# Patient Record
Sex: Female | Born: 1965 | Race: White | Hispanic: No | Marital: Single | Smoking: Never smoker
Health system: Southern US, Community
[De-identification: ages and names within clinical notes are randomized; demographics above are authoritative.]

## PROBLEM LIST (undated history)

## (undated) DIAGNOSIS — J309 Allergic rhinitis, unspecified: Secondary | ICD-10-CM

## (undated) DIAGNOSIS — F32A Depression, unspecified: Secondary | ICD-10-CM

## (undated) DIAGNOSIS — Z970 Presence of artificial eye: Secondary | ICD-10-CM

## (undated) DIAGNOSIS — R202 Paresthesia of skin: Secondary | ICD-10-CM

## (undated) DIAGNOSIS — R200 Anesthesia of skin: Secondary | ICD-10-CM

## (undated) DIAGNOSIS — F329 Major depressive disorder, single episode, unspecified: Secondary | ICD-10-CM

## (undated) DIAGNOSIS — K649 Unspecified hemorrhoids: Secondary | ICD-10-CM

## (undated) DIAGNOSIS — H269 Unspecified cataract: Secondary | ICD-10-CM

## (undated) HISTORY — DX: Unspecified cataract: H26.9

## (undated) HISTORY — PX: BACK SURGERY: SHX140

## (undated) HISTORY — DX: Unspecified hemorrhoids: K64.9

---

## 2004-11-29 HISTORY — PX: EYE SURGERY: SHX253

## 2006-02-27 HISTORY — PX: UPPER GI ENDOSCOPY: SHX6162

## 2006-03-02 ENCOUNTER — Ambulatory Visit: Payer: Self-pay | Admitting: Gastroenterology

## 2007-09-30 HISTORY — PX: COLONOSCOPY: SHX174

## 2007-10-11 ENCOUNTER — Ambulatory Visit: Payer: Self-pay | Admitting: Gastroenterology

## 2010-08-18 ENCOUNTER — Emergency Department: Payer: Self-pay | Admitting: Emergency Medicine

## 2010-08-19 ENCOUNTER — Emergency Department: Payer: Self-pay | Admitting: Emergency Medicine

## 2010-08-29 ENCOUNTER — Ambulatory Visit: Payer: Self-pay | Admitting: Family Medicine

## 2011-11-29 IMAGING — CR DG CHEST 2V
1 series · 2 of 2 positions shown · non-contrast
Comparison: none

REASON FOR EXAM: fall
COMMENTS:

PROCEDURE:     DXR - DXR CHEST PA (OR AP) AND LATERAL  - August 19, 2010  [DATE]
RESULT:     The lung fields are clear. No pneumonia, pneumothorax or pleural
effusion is seen. The mediastinal and osseous structures show no significant
abnormalities.

[Series 1: view not recorded · 0.17mm/px · 2 of 2 slices shown]
[im 1/2]
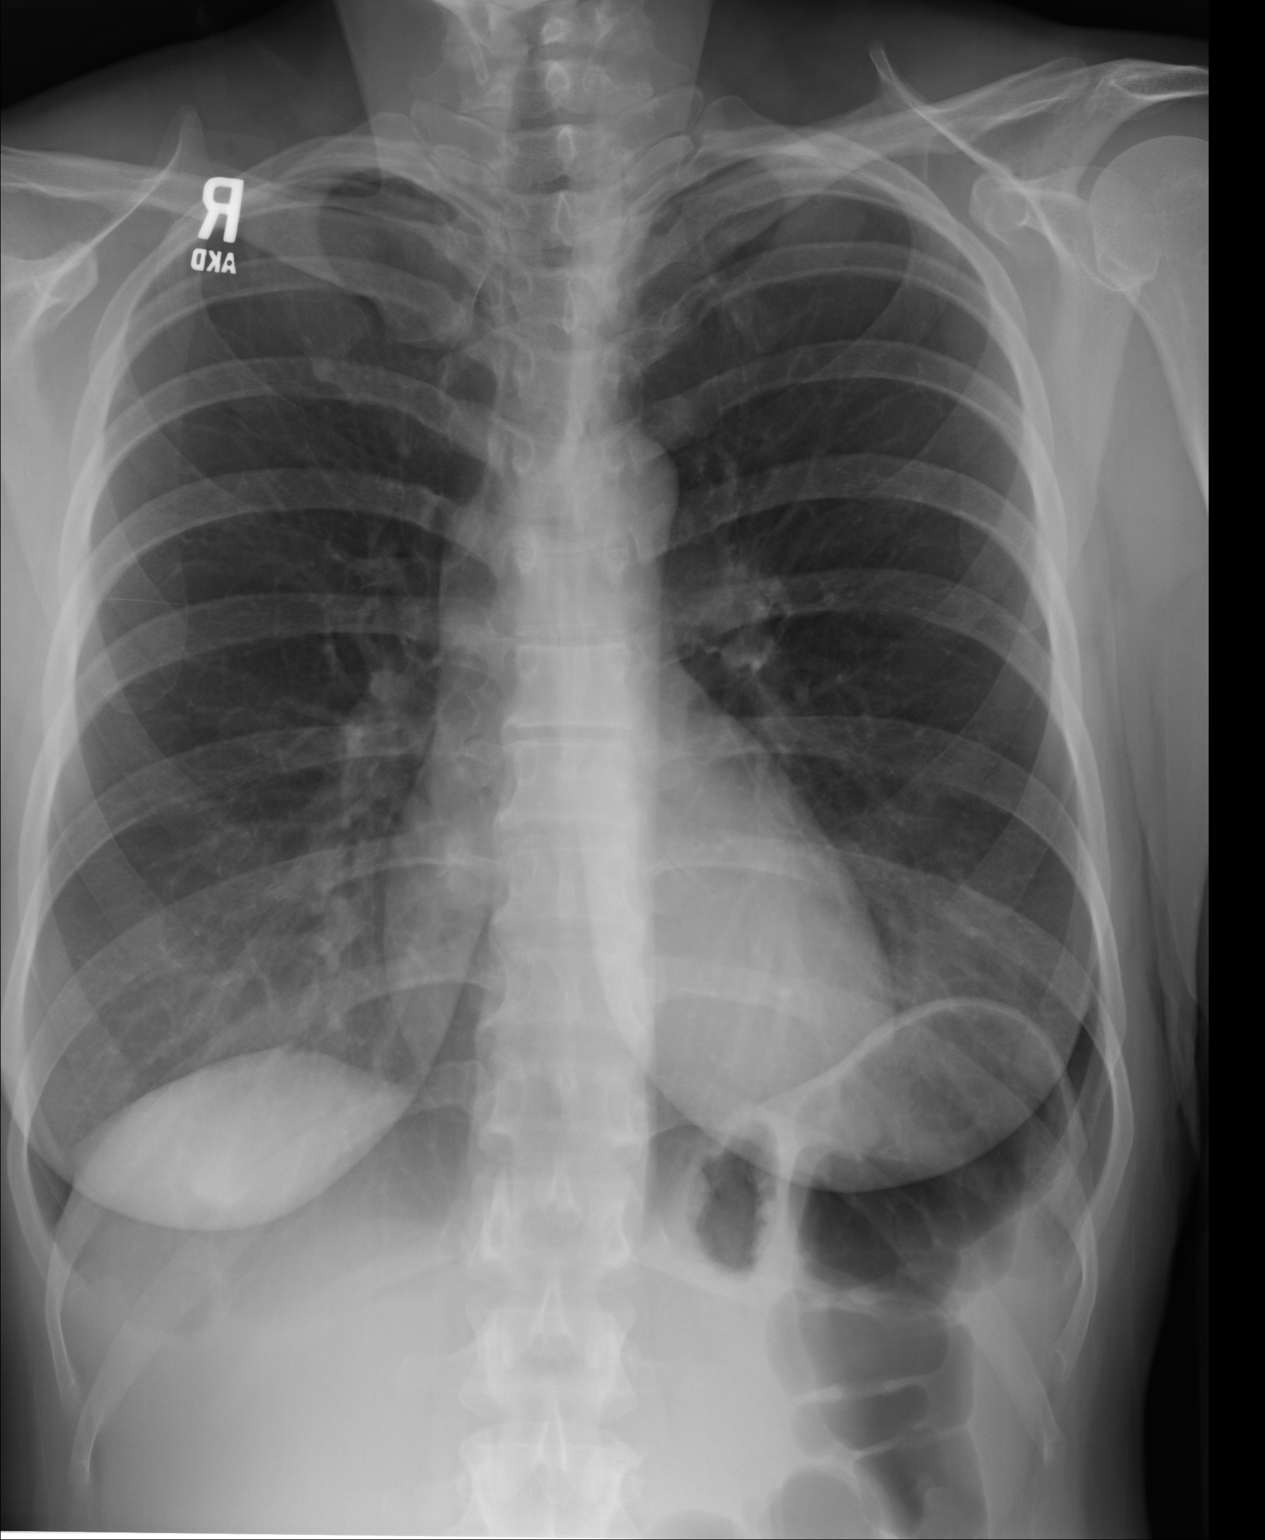
[im 2/2]
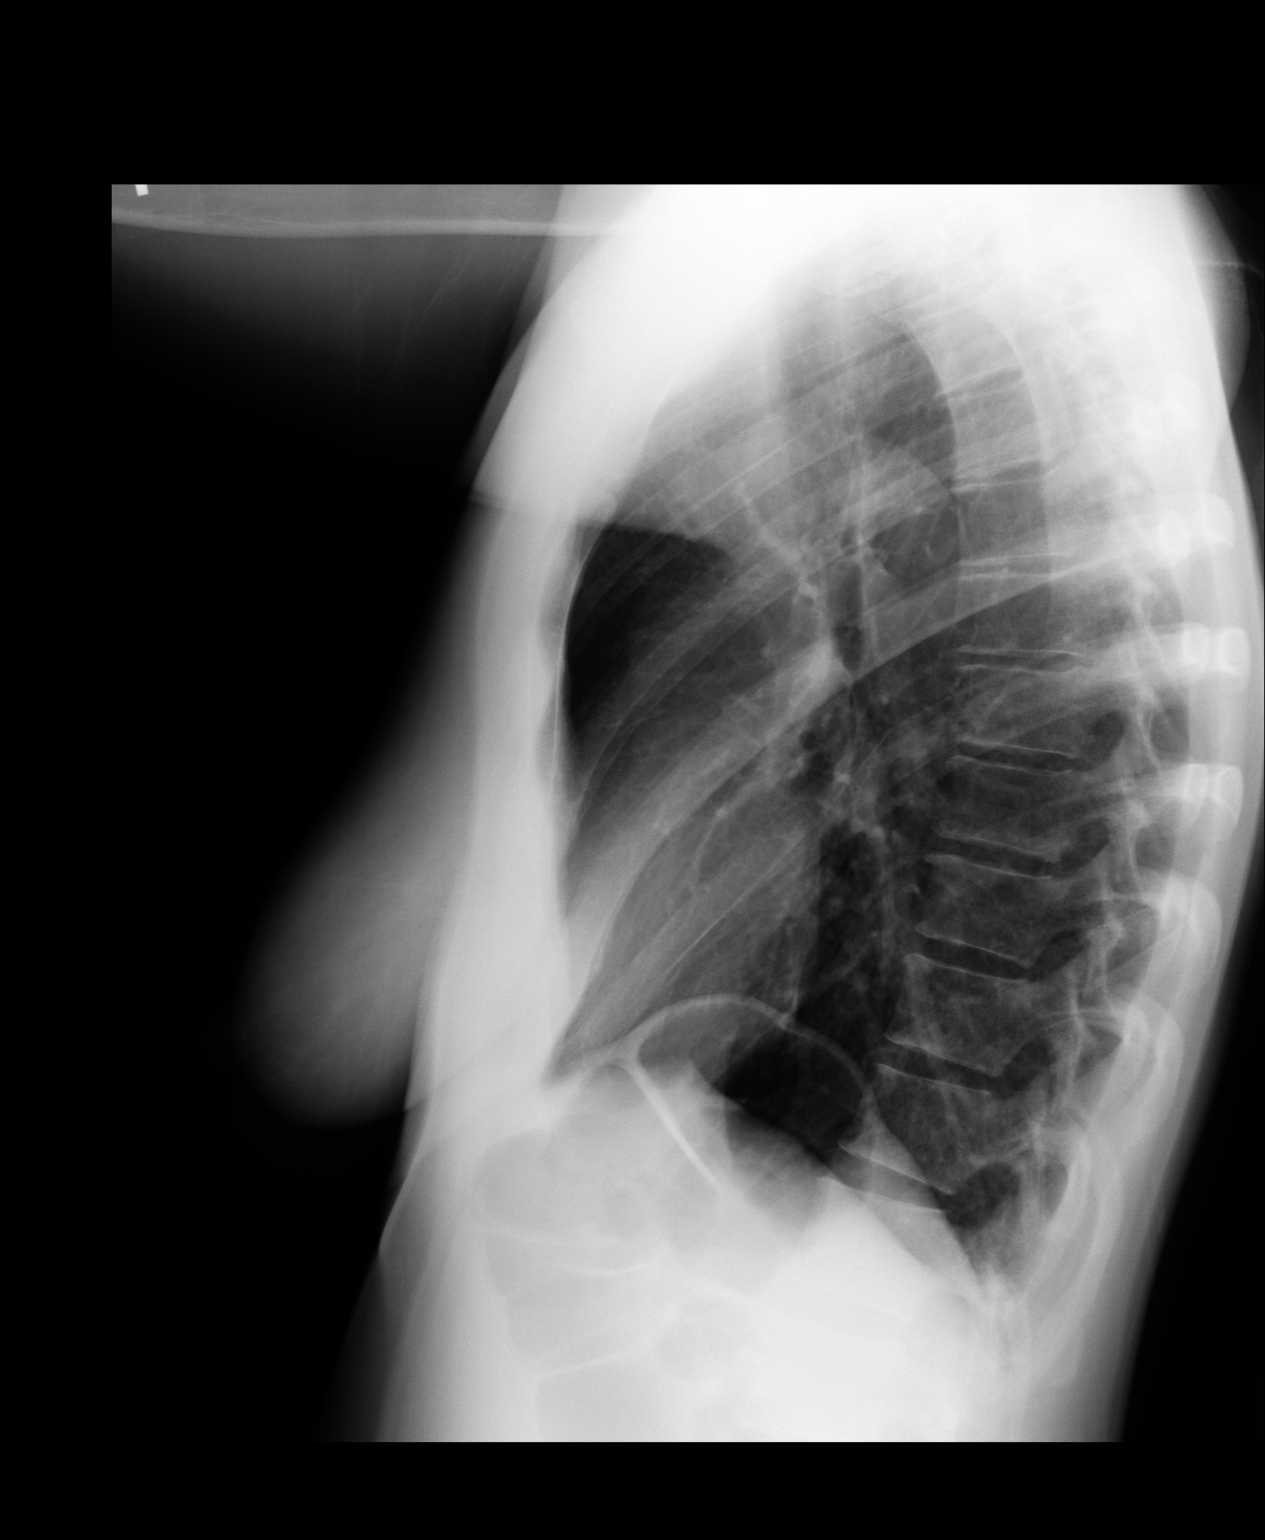

[2 of 2 positions shown; findings below may reference images not displayed]

IMPRESSION: No significant abnormalities are noted.

## 2012-07-27 ENCOUNTER — Emergency Department: Payer: Self-pay | Admitting: Emergency Medicine

## 2012-11-29 HISTORY — PX: APPENDECTOMY: SHX54

## 2013-04-30 ENCOUNTER — Observation Stay: Payer: Self-pay | Admitting: Surgery

## 2013-04-30 ENCOUNTER — Ambulatory Visit: Payer: Self-pay | Admitting: Internal Medicine

## 2013-04-30 LAB — PREGNANCY, URINE: Pregnancy Test, Urine: NEGATIVE m[IU]/mL

## 2013-05-02 LAB — PATHOLOGY REPORT

## 2013-12-05 ENCOUNTER — Ambulatory Visit: Payer: Self-pay | Admitting: Surgery

## 2014-06-03 ENCOUNTER — Ambulatory Visit: Payer: Self-pay

## 2014-10-21 ENCOUNTER — Encounter: Payer: Self-pay | Admitting: General Surgery

## 2014-11-06 ENCOUNTER — Other Ambulatory Visit: Payer: Self-pay | Admitting: General Surgery

## 2014-11-06 ENCOUNTER — Encounter: Payer: Self-pay | Admitting: General Surgery

## 2014-11-06 ENCOUNTER — Ambulatory Visit (INDEPENDENT_AMBULATORY_CARE_PROVIDER_SITE_OTHER): Payer: BC Managed Care – PPO | Admitting: General Surgery

## 2014-11-06 VITALS — BP 118/72 | HR 74 | Resp 14 | Ht 65.0 in | Wt 155.0 lb

## 2014-11-06 DIAGNOSIS — Z800 Family history of malignant neoplasm of digestive organs: Secondary | ICD-10-CM

## 2014-11-06 DIAGNOSIS — Z1211 Encounter for screening for malignant neoplasm of colon: Secondary | ICD-10-CM

## 2014-11-06 MED ORDER — POLYETHYLENE GLYCOL 3350 17 GM/SCOOP PO POWD: ORAL | Status: DC

## 2014-11-06 NOTE — Progress Notes (Signed)
Patient ID: Erin [name], female   DOB: [birth year], 48 y.o.   MRN: [id number]  Chief Complaint  Patient presents with  . Other    screening colonoscopy    HPI Erin [name] is a 48 y.o. female here today for a evaluation of a screenning colonoscopy . Patient states no Gi problems. Last colonoscopy was done in 10/11/2007 at [facility] by Dr. Servando SnareWohl. Review of the procedure note showed no abnormalities identified. HPI  No past medical history on file.  Past Surgical History  Procedure Laterality Date  . Appendectomy  2014  . Back surgery  2013  . Upper gi endoscopy  02/2006  . Colonoscopy  09/2007  . Eye surgery Right 2006    Family History  Problem Relation Age of Onset  . Colon cancer Paternal Grandmother     4750's  . Colon cancer Paternal Aunt     great aunt 2  . Colon cancer Paternal Uncle     great    Social History History  Substance Use Topics  . Smoking status: Never Smoker   . Smokeless tobacco: Never Used  . Alcohol Use: No    No Known Allergies  Current Outpatient Prescriptions  Medication Sig Dispense Refill  . calcium carbonate 200 MG capsule Take 250 mg by mouth daily.    . citalopram (CELEXA) 20 MG tablet Take 1 tablet by mouth daily.  1  . polyethylene glycol powder (GLYCOLAX/MIRALAX) powder 255 grams one bottle for colonoscopy prep 255 g 0   No current facility-administered medications for this visit.    Review of Systems Review of Systems  Constitutional: Negative.   Respiratory: Negative.   Cardiovascular: Negative.     Blood pressure 118/72, pulse 74, resp. rate 14, height 5\' 5"  (1.651 m), weight 155 lb (70.308 kg), last menstrual period 10/24/2014.  Physical Exam Physical Exam  Constitutional: She is oriented to person, place, and time. She appears well-developed and well-nourished.  Cardiovascular: Normal rate and regular rhythm.   Pulmonary/Chest: Effort normal and breath sounds normal.  Neurological: She is alert and oriented to person,  place, and time.  Skin: Skin is warm and dry.    Data Reviewed 2008 colonoscopy report  Assessment    Distant family history of colon cancer. No first-degree relatives. Now at average age for screening exams to begin.    Plan    The pros and cons of screening colonoscopy with her family history was reviewed. The risks of the procedure including those related to bleeding and perforation were discussed.    Patient has been scheduled for [facility]11[facility].    Earline MayotteByrnett, Nickolette Espinola W 11/06/2014, 6:41 PM

## 2014-11-06 NOTE — Patient Instructions (Addendum)
Colonoscopy A colonoscopy is an exam to look at the entire large intestine (colon). This exam can help find problems such as tumors, polyps, inflammation, and areas of bleeding. The exam takes about 1 hour.  LET [facility]OUR HEALTH CARE PROVIDER KNOW ABOUT:   Any allergies you have.  All medicines you are taking, including vitamins, herbs, eye drops, creams, and over-the-counter medicines.  Previous problems you or members of your family have had with the use of anesthetics.  Any blood disorders you have.  Previous surgeries you have had.  Medical conditions you have. RISKS AND COMPLICATIONS  Generally, this is a safe procedure. However, as with any procedure, complications can occur. Possible complications include:  Bleeding.  Tearing or rupture of the colon wall.  Reaction to medicines given during the exam.  Infection (rare). BEFORE THE PROCEDURE   Ask your health care provider about changing or stopping your regular medicines.  You may be prescribed an oral bowel prep. This involves drinking a large amount of medicated liquid, starting the day before your procedure. The liquid will cause you to have multiple loose stools until your stool is almost clear or light green. This cleans out your colon in preparation for the procedure.  Do not eat or drink anything else once you have started the bowel prep, unless your health care provider tells you it is safe to do so.  Arrange for someone to drive you home after the procedure. PROCEDURE   You will be given medicine to help you relax (sedative).  You will lie on your side with your knees bent.  A long, flexible tube with a light and camera on the end (colonoscope) will be inserted through the rectum and into the colon. The camera sends video back to a computer screen as it moves through the colon. The colonoscope also releases carbon dioxide gas to inflate the colon. This helps your health care provider see the area better.  During  the exam, your health care provider may take a small tissue sample (biopsy) to be examined under a microscope if any abnormalities are found.  The exam is finished when the entire colon has been viewed. AFTER THE PROCEDURE   Do not drive for 24 hours after the exam.  You may have a small amount of blood in your stool.  You may pass moderate amounts of gas and have mild abdominal cramping or bloating. This is caused by the gas used to inflate your colon during the exam.  Ask when your test results will be ready and how you will get your results. Make sure you get your test results. Document Released: 11/12/2000 Document Revised: 09/05/2013 Document Reviewed: 07/23/2013 [facility] Patient Information 2015 [employer] [place]. This information is not intended to replace advice given to you by your health care provider. Make sure you discuss any questions you have with your health care provider.  Patient [facility] been [facility].

## 2014-12-02 ENCOUNTER — Telehealth: Payer: Self-pay | Admitting: *Deleted

## 2014-12-02 NOTE — Telephone Encounter (Signed)
Patient confirms no medication changes since last office visit. She does have Miralax prescription but has yet to pre-register. Patient was instructed to pre-register no later than Friday of this week. This patient states she will try and complete this online.   We will proceed with colonoscopy as scheduled for next week. Patient instructed to call should she have further questions.

## 2014-12-02 NOTE — Telephone Encounter (Signed)
Message left for patient to call the office.   Patient is scheduled for a colonoscopy at [facility] for 12-11-14. We need to confirm that she has had no medication changes since last office visit. Also, need to confirm that she has Miralax prescription and has pre-registered.

## 2014-12-11 ENCOUNTER — Ambulatory Visit: Payer: Self-pay | Admitting: General Surgery

## 2014-12-11 DIAGNOSIS — Z1211 Encounter for screening for malignant neoplasm of colon: Secondary | ICD-10-CM

## 2014-12-12 ENCOUNTER — Encounter: Payer: Self-pay | Admitting: General Surgery

## 2015-03-21 NOTE — Discharge Summary (Signed)
Dates of Admission and Diagnosis:  Date of Admission 30-Apr-2013   Date of Discharge 01-Jan-0001   DISCHARGE INSTRUCTIONS HOME MEDS:  Medication Reconciliation: Patient's Home Medications at Discharge:     Medication Instructions  celexa  1 tab(s) orally once a day; pt doesn't remember dosage   acetaminophen-hydrocodone 325 mg-5 mg oral tablet  1 tab(s) orally every 4 hours, As needed, pain     Physician's Instructions:  Diet Regular   Electronic Signatures: Marylou MccoyLobaugh, Nora (RN)  (Signed 03-Jun-14 20:01)  Authored: ADMISSION DATE AND DIAGNOSIS, DISCHARGE INSTRUCTIONS HOME MEDS, PATIENT INSTRUCTIONS   Last Updated: 03-Jun-14 20:01 by Marylou MccoyLobaugh, Nora (RN)

## 2015-03-21 NOTE — H&P (Signed)
   Subjective/Chief Complaint RLQ pain, nausea x 2 days   History of Present Illness Erin [name] is a pleasant 49 yo F with a history of back surgeries who presents with 2 days of RLQ pain and nausea.  She became nauseated with diarrhea approx 11 am.  She took immodium initially.  No more diarrhea.  Developed RLQ pain that night.  Worsening.  Worse with movement.  Last PO was some toast this am but otherwise just crackers since then.  No similar pain in past.  + subjective fevers and chills.  WBC 6.3.  Otherwise labs unremarkable.   Past History Eye prosthesis 6 years ago H/o backsurgery for ruptured disk   Past Med/Surgical Hx:  back injury from fall:   prostetic eye:   ALLERGIES:  No Known Allergies:   HOME MEDICATIONS: Medication Instructions Status  acetaminophen-oxyCODONE 325 mg-5 mg oral tablet 1 or 2  tab(s) orally every 6 hours Active  tramadol 50 mg oral tablet 1 tab(s) orally every 4 hours Active   Family and Social History:  Family History Coronary Artery Disease  Cancer  GM with colon cancer, Dad with MI   Social History negative tobacco, negative ETOH, negative Illicit drugs   Place of Living Home  Here with husband/daughter   Review of Systems:  Subjective/Chief Complaint RLQ pain, nausea/vomiting   Fever/Chills Yes   Cough No   Sputum No   Abdominal Pain Yes   Diarrhea No  Resolved   Constipation No   Nausea/Vomiting Yes   SOB/DOE No   Chest Pain No   Dysuria No   Physical Exam:  GEN Appears uncomfortable   HEENT pink conjunctivae, PERRL, hearing intact to voice   RESP normal resp effort  clear BS  no use of accessory muscles   CARD regular rate  no murmur  no thrills  No LE edema   ABD positive tenderness  no hernia  soft  normal BS  hypoactive BS   EXTR negative cyanosis/clubbing, negative edema   SKIN normal to palpation, No rashes, No ulcers   NEURO cranial nerves intact, negative Babinski R/L, negative rigidity, negative tremor,  follows commands, strength:   PSYCH A+O to time, place, person, good insight    Assessment/Admission Diagnosis Erin [name] is a pleasant 49 yo F with 2 days of RLQ pain, nausea.  CT shows edematous appendix.  Tender to palpation RLQ.  WBC normal.   Plan Although I am unable to explain why Erin [name] has a normal WBC, I feel that overall her presentation and imaging findings are consistent with acute appendicitis.  [facility] and will defer final decision to [facility], Dr. Anda KraftMarterre.   Electronic Signatures: Jarvis NewcomerLundquist, Relena Ivancic A (MD)  (Signed 02-Jun-14 15:37)  Authored: CHIEF COMPLAINT and HISTORY, PAST MEDICAL/SURGIAL HISTORY, ALLERGIES, HOME MEDICATIONS, FAMILY AND SOCIAL HISTORY, REVIEW OF SYSTEMS, PHYSICAL EXAM, ASSESSMENT AND PLAN   Last Updated: 02-Jun-14 15:37 by Jarvis NewcomerLundquist, Alexius Hangartner A (MD)

## 2015-03-21 NOTE — Op Note (Signed)
PATIENT NAME:  Erin [name], Erin [name] MR#:  [id number] DATE OF BIRTH:  1966-05-21  DATE OF PROCEDURE:  05/01/2013  PREOPERATIVE DIAGNOSIS: Acute appendicitis.   POSTOPERATIVE DIAGNOSIS: Acute appendicitis.   PROCEDURE: Laparoscopic appendectomy.   SURGEON: Raynald KempMark A Worthy Boschert, M.[name]. FACS  ASSISTANT: Scrub tech.   TYPE OF ANESTHESIA: General oral endotracheal.   FINDINGS: Acute suppurative appendicitis.   SPECIMENS: Appendix.   ESTIMATED BLOOD LOSS: Minimal.  LAP AND NEEDLE COUNT: Correct x 2.   DESCRIPTION OF PROCEDURE: With informed consent, supine position, general oral endotracheal anesthesia was induced. The left arm was padded and tucked at her side. Abdomen was widely prepped and draped with ChloraPrep solution. Timeout was observed.   A 12 mm blunt Hassan trocar was placed through a short transverse infraumbilical skin incision with scalpel and carried down with sharp dissection to the abdominal midline fascia, which was incised in the midline and elevated with Kocher clamps. The peritoneum was entered bluntly. A 12 mm blunt Hassan trocar was placed. Pneumoperitoneum was established. A 5 mm Bladeless trocar was placed in the right upper quadrant. A 5 mm Bladeless trocar in the left lower quadrant. The patient was then positioned in Trendelenburg and airplane right side up. The appendix was readily identifiable, grasped along its midportion as the tip was inflamed with a suppurative inflammatory response. The base of the appendix was identified clearly. A window was fashioned at the base of the appendix separating the mesoappendix from the appendix. The mesoappendix was easily divided with a single fire of the white load of the endoscopic GIA 35 mm stapler with the base of the appendix then being divided with a blue load of the stapler.   The specimen was captured in an Endo Catch device and easily retrieved.   The right lower quadrant was irrigated with a total of 1 L of normal saline. Inspection  of the pelvis demonstrated a moderate sized uterine fibroid. Right ovary and tube appeared normal. Left tube and ovary could not be identified due to port placement.   Ports were then removed under direct visualization. The infraumbilical fascial defect being reapproximated with a figure-of-eight #0 Vicryl suture in vertical orientation. A total of 30 mL of 0.25% plain Marcaine was infiltrated along all skin and fascial incisions prior to closure. 4-0 Vicryl subcuticular was applied to all skin edges followed by the application of benzoin, Steri-Strips, Telfa, and Tegaderm. The patient was then subsequently extubated and taken to the recovery room in stable and satisfactory condition by anesthesia services.    ____________________________ Redge GainerMark A. Egbert GaribaldiBird, MD mab:aw [name]: 05/01/2013 07:38:40 ET T: 05/01/2013 07:49:56 ET JOB#: [id number]  cc: Loraine LericheMark A. Egbert GaribaldiBird, MD, <Dictator> Jorje GuildGlenn R. Beckey DowningWillett, MD Raynald KempMARK A Eriel Dunckel MD ELECTRONICALLY SIGNED 05/01/2013 22:33

## 2015-03-21 NOTE — Discharge Summary (Signed)
PATIENT NAME:  Erin [name], Erin [name] MR#:  [id number] DATE OF BIRTH:  [birth year]  DATE OF ADMISSION:  04/30/2013 DATE OF DISCHARGE:  05/01/2013  FINAL DIAGNOSIS: Acute suppurative appendicitis.   PROCEDURE PERFORMED: Laparoscopic appendectomy by Dr. Egbert GaribaldiBird on 05/01/2013.   HOSPITAL COURSE SUMMARY: The patient was admitted with acute appendicitis both by clinical examination and by radiograms. I assumed her care from Dr. Juliann PulseLundquist, who had admitted her through the office. On the late evening of 2nd of June, early morning 3rd of June, the patient underwent an uncomplicated laparoscopic appendectomy. Suppurative findings were encountered. There was no evidence of peritonitis or perforation. Her postoperative course was unremarkable. She tolerated a regular diet, ambulated and did not have nausea and vomiting, adequate pain control with oral narcotics, and was discharged home on the evening of June 3 into the care of her family.   DISCHARGE MEDICATIONS: Celexa 1 tab by mouth once a day, acetaminophen/hydrocodone 5/325 mg tablet 1 tab every 4 hours as needed for pain.   She will follow up with me in the office on Monday or Tuesday of next week in [place].  Call with any questions or concerns.    ____________________________ Redge GainerMark A. Egbert GaribaldiBird, MD mab:cc [name]: 05/01/2013 20:56:00 ET T: 05/01/2013 22:56:50 ET JOB#: [id number]  cc: Jorje GuildGlenn R. Beckey DowningWillett, MD Redge GainerMark A. Egbert GaribaldiBird, MD, <Dictator>   Prem Coykendall A Lashandra Arauz MD ELECTRONICALLY SIGNED 05/02/2013 0:04

## 2015-07-15 ENCOUNTER — Other Ambulatory Visit: Payer: Self-pay | Admitting: Family Medicine

## 2015-07-15 DIAGNOSIS — R29898 Other symptoms and signs involving the musculoskeletal system: Secondary | ICD-10-CM

## 2015-07-15 DIAGNOSIS — R200 Anesthesia of skin: Secondary | ICD-10-CM

## 2015-07-16 ENCOUNTER — Ambulatory Visit
Admission: RE | Admit: 2015-07-16 | Discharge: 2015-07-16 | Disposition: A | Discharge status: Home / Self Care | Payer: BC Managed Care – PPO | Source: Ambulatory Visit | Attending: Family Medicine | Admitting: Family Medicine

## 2015-07-16 DIAGNOSIS — R29898 Other symptoms and signs involving the musculoskeletal system: Secondary | ICD-10-CM

## 2015-07-16 DIAGNOSIS — M2141 Flat foot [pes planus] (acquired), right foot: Secondary | ICD-10-CM | POA: Diagnosis present

## 2015-07-16 DIAGNOSIS — Z9889 Other specified postprocedural states: Secondary | ICD-10-CM | POA: Insufficient documentation

## 2015-07-16 DIAGNOSIS — M4806 Spinal stenosis, lumbar region: Secondary | ICD-10-CM | POA: Diagnosis not present

## 2015-07-16 DIAGNOSIS — R200 Anesthesia of skin: Secondary | ICD-10-CM

## 2015-07-16 DIAGNOSIS — M5126 Other intervertebral disc displacement, lumbar region: Secondary | ICD-10-CM | POA: Insufficient documentation

## 2015-09-01 ENCOUNTER — Encounter: Payer: Self-pay | Admitting: Surgery

## 2015-09-01 ENCOUNTER — Ambulatory Visit (INDEPENDENT_AMBULATORY_CARE_PROVIDER_SITE_OTHER): Payer: BC Managed Care – PPO | Admitting: Surgery

## 2015-09-01 ENCOUNTER — Encounter (INDEPENDENT_AMBULATORY_CARE_PROVIDER_SITE_OTHER): Payer: Self-pay

## 2015-09-01 VITALS — BP 129/69 | HR 76 | Temp 98.1°F | Ht 65.0 in | Wt 152.0 lb

## 2015-09-01 DIAGNOSIS — K645 Perianal venous thrombosis: Secondary | ICD-10-CM | POA: Diagnosis not present

## 2015-09-01 DIAGNOSIS — Z970 Presence of artificial eye: Secondary | ICD-10-CM | POA: Insufficient documentation

## 2015-09-01 DIAGNOSIS — F329 Major depressive disorder, single episode, unspecified: Secondary | ICD-10-CM | POA: Insufficient documentation

## 2015-09-01 DIAGNOSIS — F32A Depression, unspecified: Secondary | ICD-10-CM | POA: Insufficient documentation

## 2015-09-01 MED ORDER — LIDOCAINE 5 % EX OINT: 1.0000 "application " | TOPICAL_OINTMENT | Freq: Four times a day (QID) | CUTANEOUS | Status: DC | PRN

## 2015-09-01 NOTE — Patient Instructions (Signed)
Warm water soaking, ice if needed, high fiber diet, plenty of water, keep stools soft.

## 2015-09-01 NOTE — Progress Notes (Signed)
Subjective:     Patient ID: Erin [name], female   DOB: 1966-10-28, 49 y.o.   MRN: [id number]  HPI  49 yr old female with hemorrhoidal pain for about 2weeks.  Patient had back surgery at the end of August and began taking stool softeners before to avoid hemorrhoids but had them flair despite keeping stools soft.  She states that she has had hemorrhoids in the past but nothing with this much pain.  Patient states that she had some blood on the tissue and in underwear requiring a pad but none in the toilet bowl.  Patient does have a family hx of colon ca in distant relatives but had colonoscopy in January without any evidence of disease.    Past Medical History:  Back pain, Eye surgery, hemorrhoids  Past Surgical History  Procedure Laterality Date  . Appendectomy  2014  . Back surgery  2013  . Upper gi endoscopy  02/2006  . Colonoscopy  09/2007  . Eye surgery Right 2006   Family History  Problem Relation Age of Onset  . Colon cancer Paternal Grandmother     46's  . Colon cancer Paternal Aunt     great aunt 2  . Colon cancer Paternal Uncle     great    Social History   Social History  . Marital Status: Legally Separated    Spouse Name: N/A  . Number of Children: N/A  . Years of Education: N/A   Social History Main Topics  . Smoking status: Never Smoker   . Smokeless tobacco: Never Used  . Alcohol Use: No  . Drug Use: No  . Sexual Activity: Not Asked   Other Topics Concern  . None   Social History Narrative    Current outpatient prescriptions:  .  gabapentin (NEURONTIN) 300 MG capsule, 4 (four) times daily. , Disp: , Rfl: 0 .  HYDROcodone-acetaminophen (NORCO/VICODIN) 5-325 MG tablet, Take by mouth., Disp: , Rfl:  .  lidocaine-hydrocortisone (ANAMANTEL HC) 3-0.5 % CREA, Apply topically., Disp: , Rfl:  .  polyethylene glycol powder (GLYCOLAX/MIRALAX) powder, 255 grams one bottle for colonoscopy prep, Disp: 255 g, Rfl: 0 .  Sennosides-Docusate Sodium (STOOL SOFTENER &  LAXATIVE PO), Take by mouth daily., Disp: , Rfl:  .  lidocaine (XYLOCAINE) 5 % ointment, Apply 1 application topically 4 (four) times daily as needed., Disp: 35.44 g, Rfl: 1  Allergies  Allergen Reactions  . Doxycycline     Other reaction(s): Other (See Comments) Blister in mouth      Review of Systems  Constitutional: Negative for fever, chills, activity change, appetite change and fatigue.  Gastrointestinal: Positive for diarrhea, blood in stool, anal bleeding and rectal pain. Negative for nausea, vomiting, abdominal pain, constipation and abdominal distention.  Genitourinary: Negative for dysuria, frequency, flank pain and difficulty urinating.  All other systems reviewed and are negative.  Filed Vitals:   09/01/15 1345  BP: 129/69  Pulse: 76  Temp: 98.1 F (36.7 C)       Objective:   Physical Exam  Constitutional: She is oriented to person, place, and time. She appears well-developed and well-nourished. No distress.  Cardiovascular: Normal rate and intact distal pulses.   Pulmonary/Chest: Effort normal. No respiratory distress.  Abdominal: Soft. She exhibits no distension. There is no tenderness.  Genitourinary:  Rectal exam: good anal wink, left sided small external hemorrhoid with minimal inflammation and tenderness mainly posterior laterally, no acute thrombosis, no bleeding, no masses  Neurological: She is alert and oriented  to person, place, and time.  Skin: Skin is warm and dry.  Psychiatric: She has a normal mood and affect. Her behavior is normal. Thought content normal.  Vitals reviewed.      Assessment:     49 yr old with hemorrhoids     Plan:     Likely thrombosed 2 weeks prior and improving now.  No acute need for surgical intervention.  Discussed management with warm bath soaks, ice packs, high fiber diet, stool softeners, topical creams and lidocaine creams.  Will give her a Rx for the Lidocaine cream.  Instructed that it should gradually improve from  this time.  May return prn if worsens or does not improve.

## 2015-11-10 ENCOUNTER — Other Ambulatory Visit: Payer: Self-pay | Admitting: Unknown Physician Specialty

## 2015-11-10 DIAGNOSIS — R928 Other abnormal and inconclusive findings on diagnostic imaging of breast: Secondary | ICD-10-CM

## 2015-11-12 ENCOUNTER — Other Ambulatory Visit: Payer: Self-pay | Admitting: *Deleted

## 2015-11-12 ENCOUNTER — Inpatient Hospital Stay
Admission: RE | Admit: 2015-11-12 | Discharge: 2015-11-12 | Disposition: A | Discharge status: Home / Self Care | Payer: Self-pay | Source: Ambulatory Visit | Attending: *Deleted | Admitting: *Deleted

## 2015-11-12 DIAGNOSIS — Z9289 Personal history of other medical treatment: Secondary | ICD-10-CM

## 2015-11-21 ENCOUNTER — Other Ambulatory Visit: Payer: Self-pay | Admitting: Unknown Physician Specialty

## 2015-11-21 ENCOUNTER — Ambulatory Visit
Admission: RE | Admit: 2015-11-21 | Discharge: 2015-11-21 | Disposition: A | Discharge status: Home / Self Care | Payer: BC Managed Care – PPO | Source: Ambulatory Visit | Attending: Unknown Physician Specialty | Admitting: Unknown Physician Specialty

## 2015-11-21 DIAGNOSIS — N63 Unspecified lump in breast: Secondary | ICD-10-CM | POA: Diagnosis not present

## 2015-11-21 DIAGNOSIS — R928 Other abnormal and inconclusive findings on diagnostic imaging of breast: Secondary | ICD-10-CM | POA: Diagnosis present

## 2015-11-21 DIAGNOSIS — N6002 Solitary cyst of left breast: Secondary | ICD-10-CM | POA: Diagnosis not present

## 2015-12-02 ENCOUNTER — Other Ambulatory Visit: Payer: Self-pay | Admitting: Unknown Physician Specialty

## 2015-12-02 ENCOUNTER — Ambulatory Visit: Payer: BC Managed Care – PPO | Admitting: General Surgery

## 2015-12-02 DIAGNOSIS — N63 Unspecified lump in unspecified breast: Secondary | ICD-10-CM

## 2015-12-03 ENCOUNTER — Ambulatory Visit
Admission: RE | Admit: 2015-12-03 | Discharge: 2015-12-03 | Disposition: A | Discharge status: Home / Self Care | Payer: BC Managed Care – PPO | Source: Ambulatory Visit | Attending: Unknown Physician Specialty | Admitting: Unknown Physician Specialty

## 2015-12-03 DIAGNOSIS — N63 Unspecified lump in unspecified breast: Secondary | ICD-10-CM

## 2015-12-03 DIAGNOSIS — N62 Hypertrophy of breast: Secondary | ICD-10-CM | POA: Diagnosis not present

## 2015-12-03 HISTORY — PX: BREAST BIOPSY: SHX20

## 2015-12-04 LAB — SURGICAL PATHOLOGY

## 2015-12-28 IMAGING — US US BREAST LTD UNI LEFT INC AXILLA
1 series · 12 of 25 positions shown · non-contrast
Comparison: Previous examinations, including the 2D screening
mammogram at [facility] on 11/05/2015.

CLINICAL DATA: Possible outer left breast asymmetry on a recent
screening mammogram at [facility].

EXAM:
DIGITAL DIAGNOSTIC LEFT MAMMOGRAM WITH 3D TOMOSYNTHESIS WITH CAD
ULTRASOUND LEFT BREAST

[Series 1: us breast ltd uni left inc axilla · 0.06mm/px · 12 of 34 slices shown]
[im 2/34]
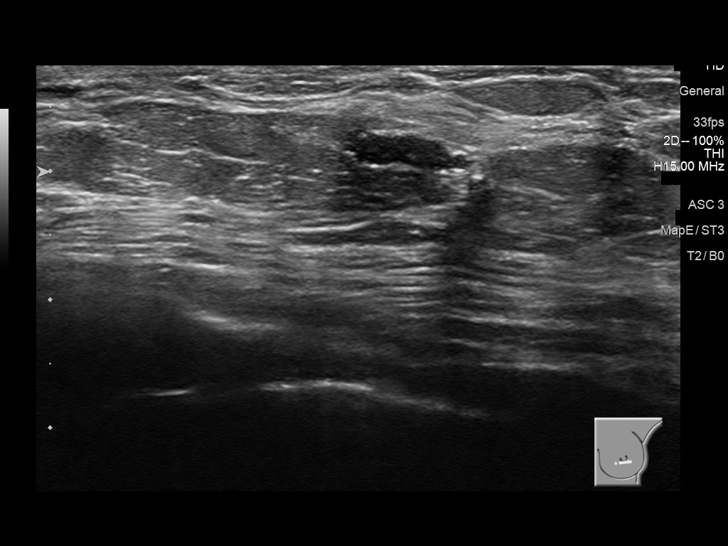
[im 5/34]
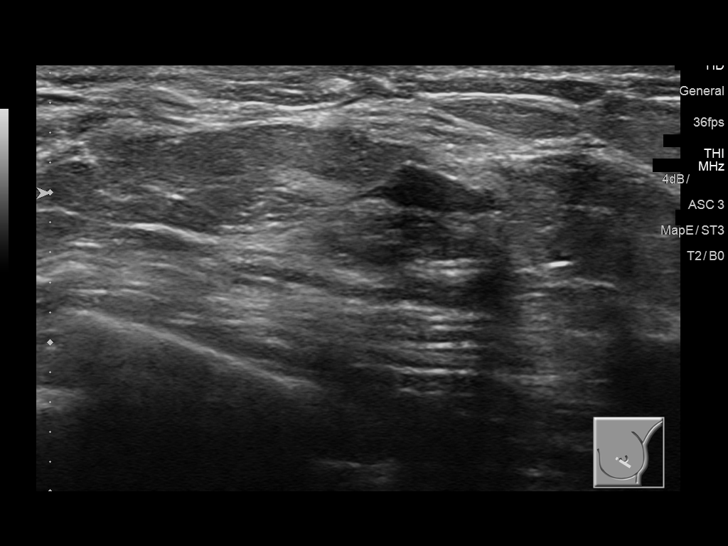
[im 7/34]
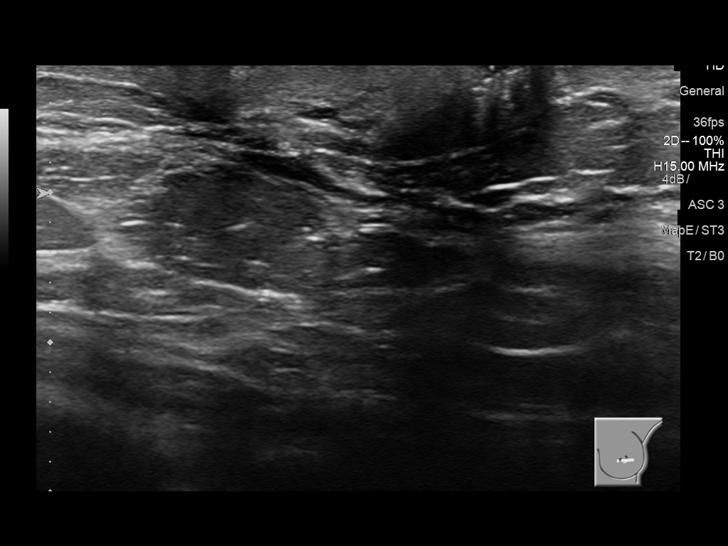
[im 10/34]
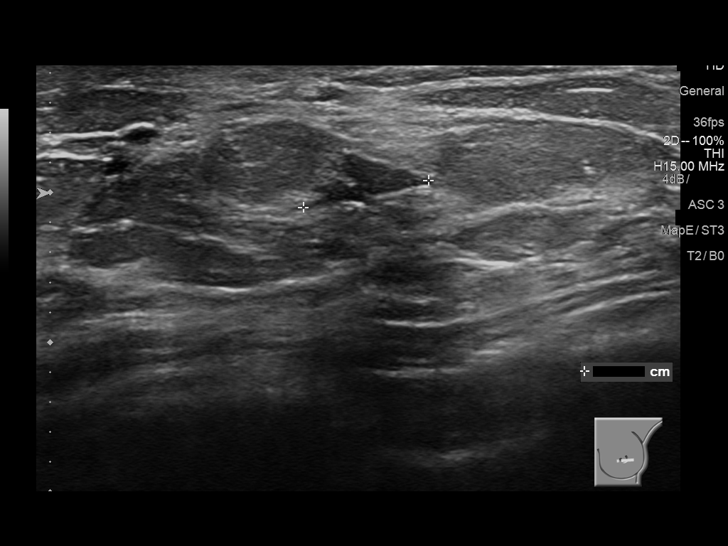
[im 13/34]
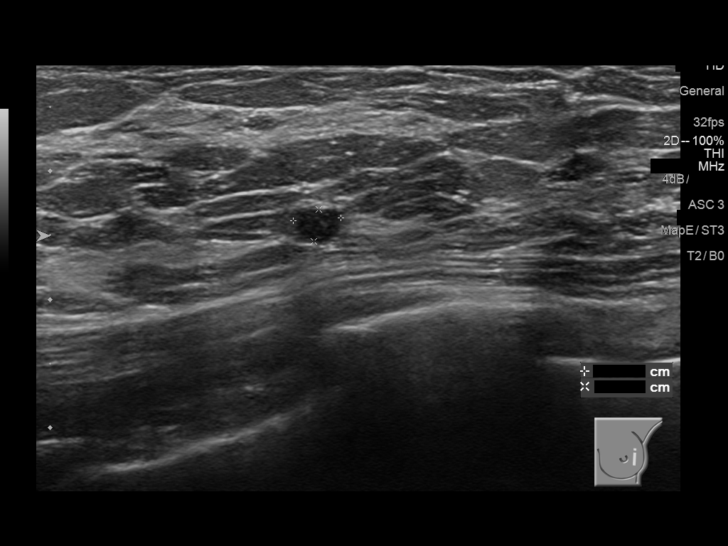
[im 16/34]
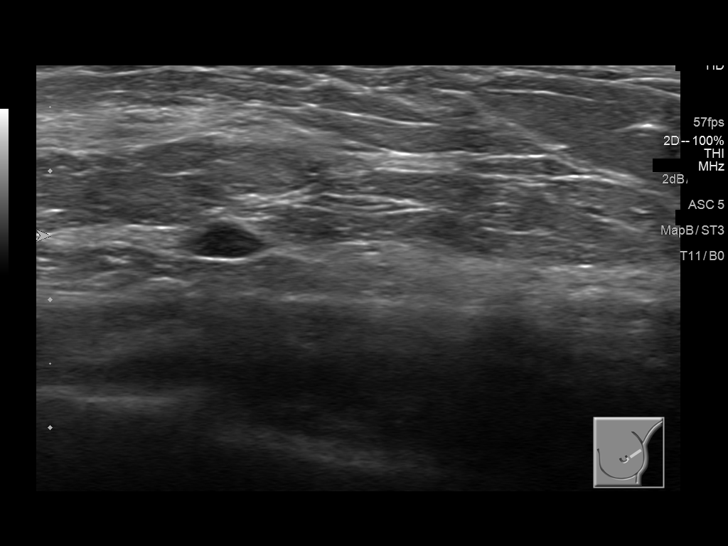
[im 18/34]
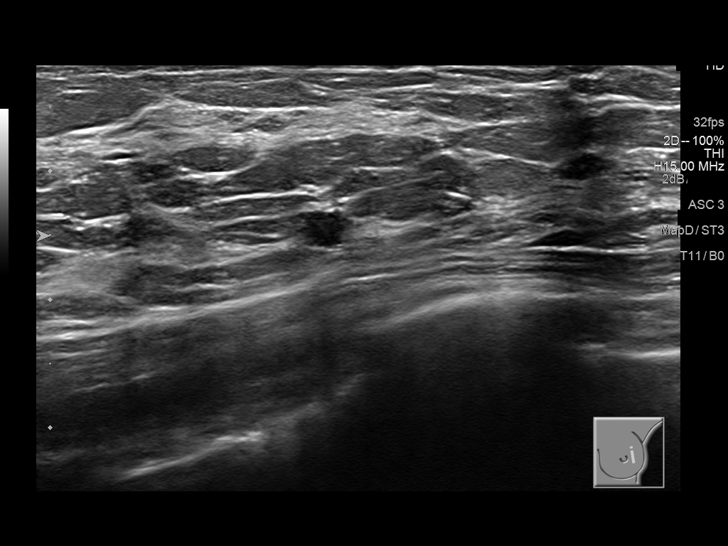
[im 21/34]
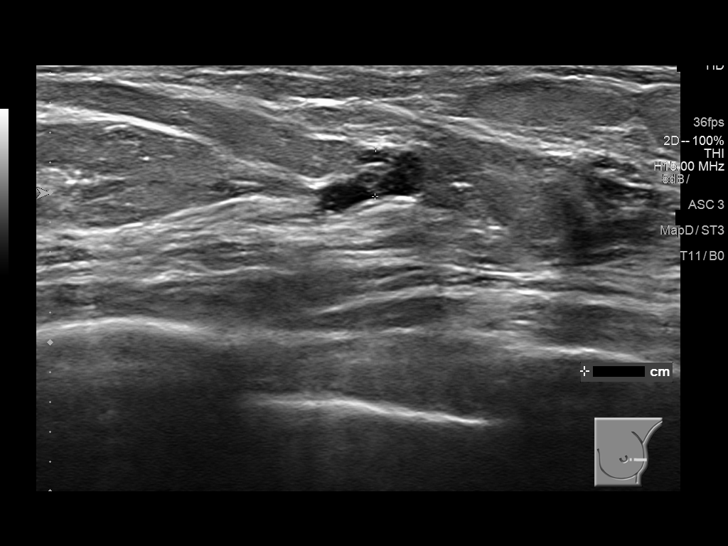
[im 24/34]
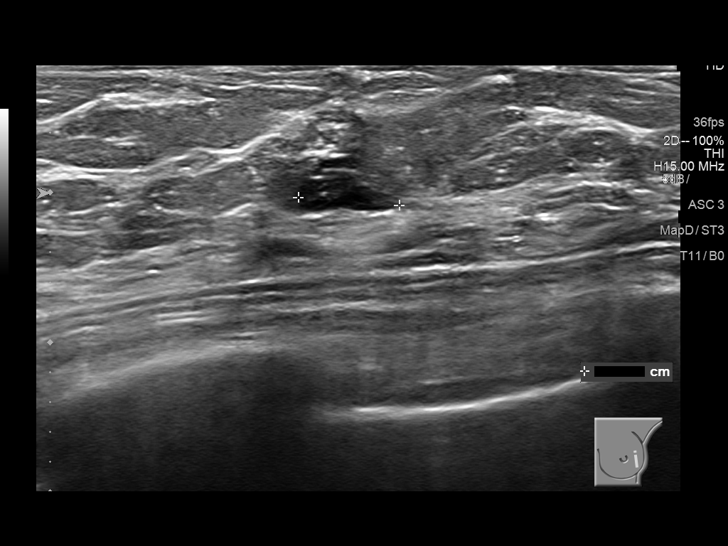
[im 27/34]
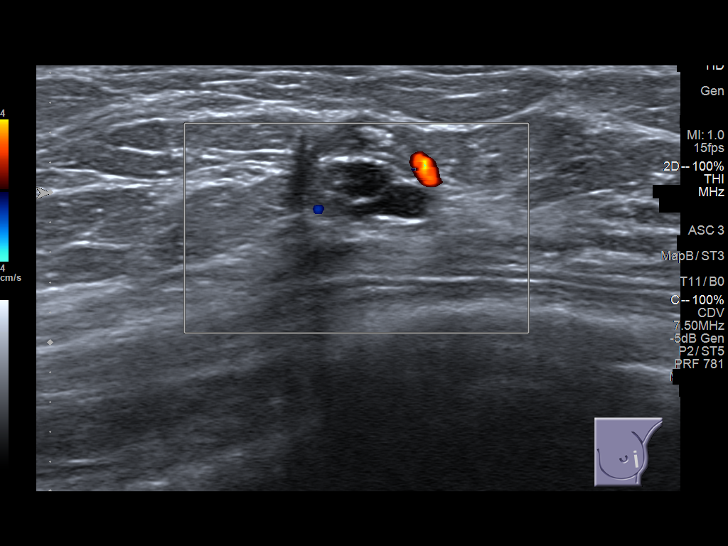
[im 29/34]
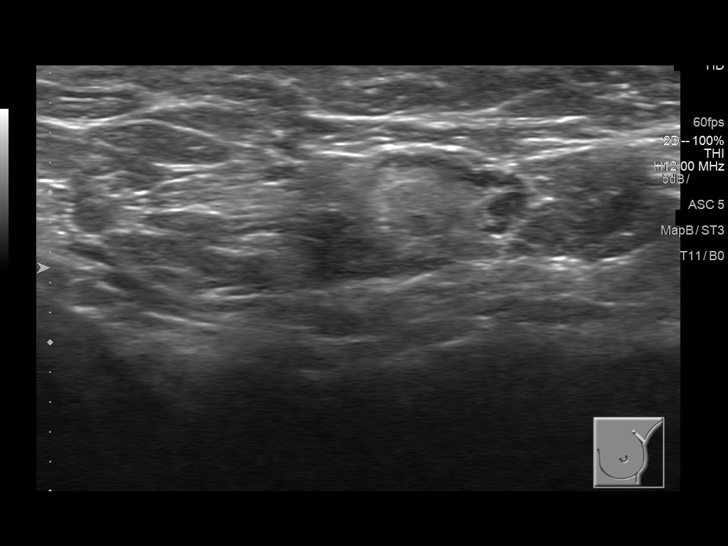
[im 32/34]
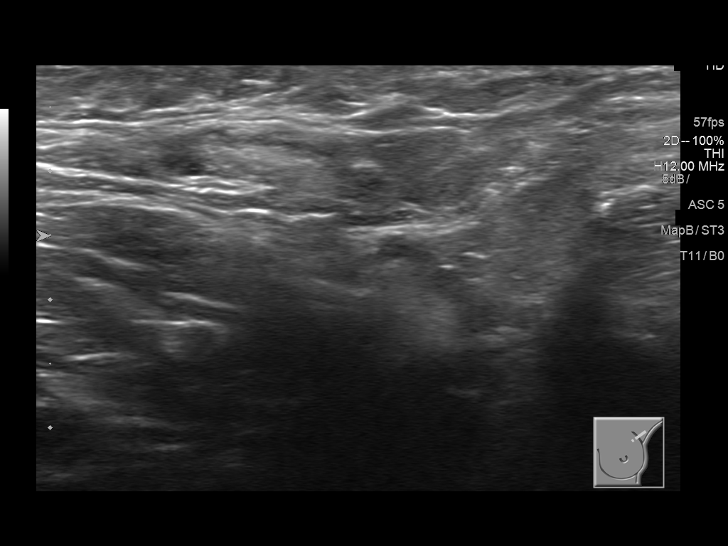

[12 of 25 positions shown; findings below may reference images not displayed]

ACR Breast Density Category c: The breast tissue is heterogeneously
dense, which may obscure small masses.
FINDINGS: 3D tomographic images of the left breast demonstrate 2 small, oval,
circumscribed nodules in the posterior aspect of the central breast,
slightly inferiorly, in the oblique projection. There is a larger
oval, circumscribed mass in the inferior retroareolar region of the
breast.

In the craniocaudal projection, there is a small, oval,
circumscribed mass in the lateral aspect of the breast and a larger
oval, circumscribed mass in the slightly lateral retroareolar
portion of the breast.

Mammographic images were processed with CAD.

On physical exam, no mass is palpable in the left breast or left
axilla.

Targeted ultrasound is performed, showing an 11 x 9 x 3 mm
elongated, horizontally oriented, circumscribed and angulated
hypoechoic mass in the 6 o'clock retroareolar region of the left
breast. The orientation is compatible with internal echoes within a
dilated duct. No internal blood flow was seen with color Doppler.

In the 2:30 o'clock position of the left breast, 2 cm from the
nipple, there is a 6 x 4 x 3 mm oval, circumscribed, hypoechoic
mass, posteriorly. No internal blood flow was seen with color
Doppler.

In the 3 o'clock position of the left breast, 3 cm from the nipple,
there is an 8 x 7 x 3 mm oval, circumscribed, hypoechoic mass
containing thin and mildly thickened internal septations. No
internal blood flow was seen with color Doppler.
IMPRESSION: 1. 11 x 9 x 3 mm focally dilated duct in the 6 o'clock retroareolar
left breast, containing diffuse internal echoes. This could
represent an intraductal papilloma, ductal carcinoma in situ or
dilated duct containing thick fluid.
2. 6 mm probable mildly complicated cyst in the 2:30 o'clock
position of the left breast.
3. 8 mm mildly complicated cyst in the 3 o'clock position of the
left breast.

RECOMMENDATION:
1. Ultrasound-guided core needle biopsy of the 11 x 9 x 3 mm dilated
duct containing a possible intraductal mass in the 6 o'clock
retroareolar left breast. This will be arranged in consultation with
the patient and her physician.
2. If the ultrasound-guided core needle biopsy of the dilated duct
is negative for malignancy or a papilloma, a 6 month follow-up left
breast ultrasound would be recommended for the 6 mm probable mildly
complicated cyst in the 2:30 o'clock position of the left breast. If
the patient will need surgery following the biopsy of the dilated
duct, then ultrasound-guided core needle biopsy of this mass would
be recommended before surgery.

I have discussed the findings and recommendations with the patient.
Results were also provided in writing at the conclusion of the
visit. If applicable, a reminder letter will be sent to the patient
regarding the next appointment.

BI-RADS CATEGORY  4: Suspicious.

## 2016-02-17 ENCOUNTER — Encounter: Payer: Self-pay | Admitting: *Deleted

## 2016-02-20 ENCOUNTER — Ambulatory Visit
Admission: RE | Admit: 2016-02-20 | Discharge: 2016-02-20 | Disposition: A | Discharge status: Home / Self Care | Payer: Worker's Compensation | Source: Ambulatory Visit | Attending: Unknown Physician Specialty | Admitting: Unknown Physician Specialty

## 2016-02-20 ENCOUNTER — Ambulatory Visit: Payer: Worker's Compensation | Admitting: Anesthesiology

## 2016-02-20 ENCOUNTER — Encounter
Admission: RE | Disposition: A | Discharge status: Home / Self Care | Payer: Self-pay | Source: Ambulatory Visit | Attending: Unknown Physician Specialty

## 2016-02-20 DIAGNOSIS — K449 Diaphragmatic hernia without obstruction or gangrene: Secondary | ICD-10-CM | POA: Insufficient documentation

## 2016-02-20 DIAGNOSIS — Z881 Allergy status to other antibiotic agents status: Secondary | ICD-10-CM | POA: Diagnosis not present

## 2016-02-20 DIAGNOSIS — M7712 Lateral epicondylitis, left elbow: Secondary | ICD-10-CM | POA: Diagnosis present

## 2016-02-20 DIAGNOSIS — Z9841 Cataract extraction status, right eye: Secondary | ICD-10-CM | POA: Insufficient documentation

## 2016-02-20 DIAGNOSIS — F329 Major depressive disorder, single episode, unspecified: Secondary | ICD-10-CM | POA: Diagnosis not present

## 2016-02-20 DIAGNOSIS — Z79899 Other long term (current) drug therapy: Secondary | ICD-10-CM | POA: Insufficient documentation

## 2016-02-20 DIAGNOSIS — H409 Unspecified glaucoma: Secondary | ICD-10-CM | POA: Diagnosis not present

## 2016-02-20 DIAGNOSIS — Z970 Presence of artificial eye: Secondary | ICD-10-CM | POA: Diagnosis not present

## 2016-02-20 DIAGNOSIS — Z8261 Family history of arthritis: Secondary | ICD-10-CM | POA: Insufficient documentation

## 2016-02-20 HISTORY — DX: Major depressive disorder, single episode, unspecified: F32.9

## 2016-02-20 HISTORY — DX: Allergic rhinitis, unspecified: J30.9

## 2016-02-20 HISTORY — DX: Anesthesia of skin: R20.0

## 2016-02-20 HISTORY — DX: Anesthesia of skin: R20.2

## 2016-02-20 HISTORY — DX: Presence of artificial eye: Z97.0

## 2016-02-20 HISTORY — DX: Depression, unspecified: F32.A

## 2016-02-20 HISTORY — PX: TENNIS ELBOW RELEASE/NIRSCHEL PROCEDURE: SHX6651

## 2016-02-20 SURGERY — TENNIS ELBOW RELEASE/NIRSCHEL PROCEDURE
Anesthesia: Regional | Site: Elbow | Laterality: Left | Wound class: Clean

## 2016-02-20 MED ORDER — ACETAMINOPHEN 160 MG/5ML PO SOLN
325.0000 mg | ORAL | Status: DC | PRN
Start: 2016-02-20 — End: 2016-02-20

## 2016-02-20 MED ORDER — LACTATED RINGERS IV SOLN
INTRAVENOUS | Status: DC
  Administered 2016-02-20: 10:00:00 via INTRAVENOUS

## 2016-02-20 MED ORDER — OXYCODONE HCL 5 MG/5ML PO SOLN: 5.0000 mg | Freq: Once | ORAL | Status: DC | PRN

## 2016-02-20 MED ORDER — FENTANYL CITRATE (PF) 100 MCG/2ML IJ SOLN
INTRAMUSCULAR | Status: DC | PRN
  Administered 2016-02-20: 100 ug via INTRAVENOUS

## 2016-02-20 MED ORDER — ONDANSETRON HCL 4 MG/2ML IJ SOLN: 4.0000 mg | Freq: Once | INTRAMUSCULAR | Status: DC | PRN

## 2016-02-20 MED ORDER — GLYCOPYRROLATE 0.2 MG/ML IJ SOLN
INTRAMUSCULAR | Status: DC | PRN
  Administered 2016-02-20: 0.1 mg via INTRAVENOUS

## 2016-02-20 MED ORDER — ACETAMINOPHEN 325 MG PO TABS
325.0000 mg | ORAL_TABLET | ORAL | Status: DC | PRN
Start: 2016-02-20 — End: 2016-02-20
  Administered 2016-02-20: 650 mg via ORAL

## 2016-02-20 MED ORDER — DEXAMETHASONE SODIUM PHOSPHATE 4 MG/ML IJ SOLN
INTRAMUSCULAR | Status: DC | PRN
  Administered 2016-02-20: 4 mg via INTRAVENOUS
  Administered 2016-02-20: 4 mg via PERINEURAL

## 2016-02-20 MED ORDER — ONDANSETRON HCL 4 MG/2ML IJ SOLN
INTRAMUSCULAR | Status: DC | PRN
  Administered 2016-02-20: 4 mg via INTRAVENOUS

## 2016-02-20 MED ORDER — PROPOFOL 10 MG/ML IV BOLUS
INTRAVENOUS | Status: DC | PRN
  Administered 2016-02-20: 150 mg via INTRAVENOUS

## 2016-02-20 MED ORDER — ROXICET 5-325 MG PO TABS: 1.0000 | ORAL_TABLET | Freq: Four times a day (QID) | ORAL | Status: AC | PRN

## 2016-02-20 MED ORDER — OXYCODONE HCL 5 MG PO TABS: 5.0000 mg | ORAL_TABLET | Freq: Once | ORAL | Status: DC | PRN

## 2016-02-20 MED ORDER — LIDOCAINE HCL (CARDIAC) 20 MG/ML IV SOLN
INTRAVENOUS | Status: DC | PRN
  Administered 2016-02-20: 40 mg via INTRATRACHEAL

## 2016-02-20 MED ORDER — HYDROMORPHONE HCL 1 MG/ML IJ SOLN: 0.2500 mg | INTRAMUSCULAR | Status: DC | PRN

## 2016-02-20 MED ORDER — ROPIVACAINE HCL 5 MG/ML IJ SOLN
INTRAMUSCULAR | Status: DC | PRN
Start: 2016-02-20 — End: 2016-02-20
  Administered 2016-02-20: 10 mL via EPIDURAL
  Administered 2016-02-20: 30 mL via EPIDURAL

## 2016-02-20 MED ORDER — MIDAZOLAM HCL 2 MG/2ML IJ SOLN
INTRAMUSCULAR | Status: DC | PRN
  Administered 2016-02-20 (×2): 2 mg via INTRAVENOUS

## 2016-02-20 SURGICAL SUPPLY — 38 items
BANDAGE ELASTIC 4 CLIP ST LF (GAUZE/BANDAGES/DRESSINGS) ×6 IMPLANT
BANDAGE ELASTIC 4 VELCRO NS (GAUZE/BANDAGES/DRESSINGS) ×6 IMPLANT
BIT DRILL 2 CANN MAGENTA (BIT) ×3 IMPLANT
BNDG COHESIVE 4X5 TAN STRL (GAUZE/BANDAGES/DRESSINGS) ×3 IMPLANT
BNDG ESMARK 4X12 TAN STRL LF (GAUZE/BANDAGES/DRESSINGS) ×3 IMPLANT
BUR RND CARBIDE 4.0 (BURR) ×3 IMPLANT
CANISTER SUCT 1200ML W/VALVE (MISCELLANEOUS) ×3 IMPLANT
CUFF TOURN SGL QUICK 18 (TOURNIQUET CUFF) ×3 IMPLANT
CUFF TOURN SGL QUICK 24 (TOURNIQUET CUFF)
CUFF TRNQT CYL 24X4X40X1 (TOURNIQUET CUFF) IMPLANT
DRAPE INCISE IOBAN 66X45 STRL (DRAPES) ×3 IMPLANT
DURAPREP 26ML APPLICATOR (WOUND CARE) ×6 IMPLANT
GAUZE SPONGE 4X4 12PLY STRL (GAUZE/BANDAGES/DRESSINGS) ×3 IMPLANT
GLOVE BIO SURGEON STRL SZ7.5 (GLOVE) ×6 IMPLANT
GLOVE BIO SURGEON STRL SZ8 (GLOVE) ×3 IMPLANT
GLOVE INDICATOR 8.0 STRL GRN (GLOVE) ×6 IMPLANT
GOWN STRL REUS W/ TWL LRG LVL3 (GOWN DISPOSABLE) ×2 IMPLANT
GOWN STRL REUS W/TWL LRG LVL3 (GOWN DISPOSABLE) ×4
KIT ROOM TURNOVER OR (KITS) ×3 IMPLANT
NS IRRIG 500ML POUR BTL (IV SOLUTION) ×3 IMPLANT
PACK EXTREMITY ARMC (MISCELLANEOUS) ×3 IMPLANT
PAD CAST CTTN 4X4 STRL (SOFTGOODS) ×2 IMPLANT
PAD GROUND ADULT SPLIT (MISCELLANEOUS) ×3 IMPLANT
PADDING CAST 4IN STRL (MISCELLANEOUS) ×2
PADDING CAST BLEND 4X4 STRL (MISCELLANEOUS) ×1 IMPLANT
PADDING CAST COTTON 4X4 STRL (SOFTGOODS) ×4
SCRUB POVIDONE IODINE 4 OZ (MISCELLANEOUS) ×3 IMPLANT
SLING ARM M TX990204 (SOFTGOODS) ×3 IMPLANT
SPLINT FAST PLASTER 5X30 (CAST SUPPLIES) ×2
SPLINT PLASTER CAST FAST 5X30 (CAST SUPPLIES) ×1 IMPLANT
SPONGE LAP 18X18 5 PK (GAUZE/BANDAGES/DRESSINGS) IMPLANT
STAPLER SKIN PROX 35W (STAPLE) ×3 IMPLANT
STOCKINETTE IMPERVIOUS LG (DRAPES) ×3 IMPLANT
STRAP BODY AND KNEE 60X3 (MISCELLANEOUS) ×3 IMPLANT
SUT VIC AB 2-0 CT1 27 (SUTURE) ×2
SUT VIC AB 2-0 CT1 TAPERPNT 27 (SUTURE) ×1 IMPLANT
SUT VIC AB 3-0 SH 27 (SUTURE) ×2
SUT VIC AB 3-0 SH 27X BRD (SUTURE) ×1 IMPLANT

## 2016-02-20 NOTE — Anesthesia Procedure Notes (Addendum)
Procedure Name: LMA Insertion Date/Time: 02/20/2016 11:01 AM Performed by: Jimmy PicketAMYOT, MICHAEL Pre-anesthesia Checklist: Patient identified, Emergency Drugs available, Suction available, Timeout performed and Patient being monitored Patient Re-evaluated:Patient Re-evaluated prior to inductionOxygen Delivery Method: Circle system utilized Preoxygenation: Pre-oxygenation with 100% oxygen Intubation Type: IV induction LMA: LMA inserted LMA Size: 4.0 Number of attempts: 1 Placement Confirmation: positive ETCO2 and breath sounds checked- equal and bilateral Tube secured with: Tape   Anesthesia Regional Block:  Supraclavicular block  Pre-Anesthetic Checklist: ,, timeout performed, Correct Patient, Correct Site, Correct Laterality, Correct Procedure, Correct Position, site marked, Risks and benefits discussed,  Surgical consent,  Pre-op evaluation,  At surgeon's request and post-op pain management  Laterality: Left  Prep: chloraprep       Needles:  Injection technique: Single-shot  Needle Type: Echogenic Stimulator Needle      Needle Gauge: 21 and 21 G    Additional Needles:  Procedures: ultrasound guided (picture in chart) Supraclavicular block  Nerve Stimulator or Paresthesia:  Response: bicep contraction, 0.45 mA,   Additional Responses:   Narrative:  Start time: 02/20/2016 9:32 AM End time: 02/20/2016 9:47 AM Injection made incrementally with aspirations every 5 mL.  Performed by: Personally  Anesthesiologist: Johny BlamerKOVACS, Tana Trefry D  Additional Notes: Functioning IV was confirmed and monitors applied.  Sterile prep and drape,hand hygiene and sterile gloves were used.Ultrasound guidance: relevant anatomy identified, needle position confirmed, local anesthetic spread visualized around nerve(s)., vascular puncture avoided.  Image printed for medical record.  Negative aspiration and negative test dose prior to incremental administration of local anesthetic. The patient tolerated the  procedure well. Vitals signes recorded in RN notes.

## 2016-02-20 NOTE — Progress Notes (Signed)
Assisted Erin [name] ANMD with left, supraclavicular block. Side rails up, monitors on throughout procedure. See vital signs in flow sheet. Tolerated Procedure well.

## 2016-02-20 NOTE — Anesthesia Postprocedure Evaluation (Signed)
Anesthesia Post Note  Patient: Erin HusbandsKelly D Connors  Procedure(s) Performed: Procedure(s) (LRB): TENNIS ELBOW RELEASE/NIRSCHEL PROCEDURE (Left)  Patient location during evaluation: PACU Anesthesia Type: General and Regional Level of consciousness: awake and alert Pain management: pain level controlled Vital Signs Assessment: post-procedure vital signs reviewed and stable Respiratory status: spontaneous breathing, nonlabored ventilation and respiratory function stable Cardiovascular status: blood pressure returned to baseline and stable Postop Assessment: no signs of nausea or vomiting Anesthetic complications: no    Ariela Mochizuki D Shaquanta Harkless

## 2016-02-20 NOTE — Addendum Note (Signed)
Addendum  created 02/20/16 1240 by Jimmy PicketMichael Garlin Batdorf, CRNA   Modules edited: Anesthesia Flowsheet

## 2016-02-20 NOTE — Transfer of Care (Signed)
Immediate Anesthesia Transfer of Care Note  Patient: Erin HusbandsKelly D Coulson  Procedure(s) Performed: Procedure(s): TENNIS ELBOW RELEASE/NIRSCHEL PROCEDURE (Left)  Patient Location: PACU  Anesthesia Type: General LMA, Regional  Level of Consciousness: awake, alert  and patient cooperative  Airway and Oxygen Therapy: Patient Spontanous Breathing and Patient connected to supplemental oxygen  Post-op Assessment: Post-op Vital signs reviewed, Patient's Cardiovascular Status Stable, Respiratory Function Stable, Patent Airway and No signs of Nausea or vomiting  Post-op Vital Signs: Reviewed and stable  Complications: No apparent anesthesia complications

## 2016-02-20 NOTE — H&P (Signed)
  H and P reviewed. No changes. Uploaded at later date. 

## 2016-02-20 NOTE — Anesthesia Preprocedure Evaluation (Signed)
Anesthesia Evaluation  Patient identified by MRN, date of birth, ID band Patient awake    Reviewed: Allergy & Precautions, H&P , NPO status , Patient's Chart, lab work & pertinent test results, reviewed documented beta blocker date and time   Airway Mallampati: II  TM Distance: >3 FB Neck ROM: full    Dental no notable dental hx.    Pulmonary neg pulmonary ROS,    Pulmonary exam normal breath sounds clear to auscultation       Cardiovascular Exercise Tolerance: Good negative cardio ROS   Rhythm:regular Rate:Normal     Neuro/Psych PSYCHIATRIC DISORDERS negative neurological ROS     GI/Hepatic negative GI ROS, Neg liver ROS,   Endo/Other  negative endocrine ROS  Renal/GU negative Renal ROS  negative genitourinary   Musculoskeletal   Abdominal   Peds  Hematology negative hematology ROS (+)   Anesthesia Other Findings   Reproductive/Obstetrics negative OB ROS                             Anesthesia Physical Anesthesia Plan  ASA: II  Anesthesia Plan: General LMA and Regional   Post-op Pain Management:    Induction:   Airway Management Planned:   Additional Equipment:   Intra-op Plan:   Post-operative Plan:   Informed Consent: I have reviewed the patients History and Physical, chart, labs and discussed the procedure including the risks, benefits and alternatives for the proposed anesthesia with the patient or authorized representative who has indicated his/her understanding and acceptance.     Plan Discussed with: CRNA  Anesthesia Plan Comments:         Anesthesia Quick Evaluation

## 2016-02-20 NOTE — Op Note (Signed)
Expand All Collapse All       PATIENT:Rogan, Nolon RodKelly D.  PRE-OPERATIVE DIAGNOSIS: LATERAL EPICONDYLITIS Left ELBOW M77.11  POST-OPERATIVE DIAGNOSIS: LATERAL EPICONDYLITIS left ELBOW.  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  The patient had conservative treatment in the form of lateral epicondylar steroid injections.  These had not completely relieved the patient's symptoms. Consequently the patient was brought in for a Nirschl procedure on the right elbow.  OP NOTE:The patient had a supraclavicular block in the preop area and then was taken to the operating room where a tourniquet was applied to her left upper arm. Satisfactory general anesthesia was achieved. The left upper extremity was prepped and draped in usual fashion for procedure about the elbow. Left upper extremity was then exsanguinated and the tourniquet was inflated. A longitudinal incision was made over the lateral epicondyle and the radial head.The interval between the extensor carpi radialis longus and the common extensor was opened up. I reflected their tendinous attachments off the lateral epicondyle. I then excised the scarred lateral epicondylar attachment of the extensor carpi radialis brevis in a V-shaped fashion. The radiohumeral joint was inspected. There was no significant synovial fluid in the joint. A very small chondral lesion was appreciated about the inferior  ulnar aspect of the radial head. Next I used a 3 mm burr to lightly decorticate the lateral epicondyle. I then drilled multiple 2 mm holes in the lateral epicondyle. The wound was irrigated with saline. I then closed the interval between the extensor carpi radialis longus and the common extensor with 2-0 Vicryl  sutures. The tourniquet was released. It was up about 29 minutes. Bleeding was controlled with coagulation cautery.  The subcutaneous tissue was then closed with 3-0 Vicryl and skin with skin staples. Betadine was applied the wound followed by a sterile dressing and a fiberglass posterior splint with the elbow flexed about 90.  The patient was then awakened and transferred to a stretcher bed.  The patient was then taken to the recovery room in satisfactory condition. Blood loss was negligible.

## 2016-02-20 NOTE — Discharge Instructions (Signed)
General Anesthesia, Adult, Care After °Refer to this sheet in the next few weeks. These instructions provide you with information on caring for yourself after your procedure. Your health care provider may also give you more specific instructions. Your treatment has been planned according to current medical practices, but problems sometimes occur. Call your health care provider if you have any problems or questions after your procedure. °WHAT TO EXPECT AFTER THE PROCEDURE °After the procedure, it is typical to experience: °· Sleepiness. °· Nausea and vomiting. °HOME CARE INSTRUCTIONS °· For the first 24 hours after general anesthesia: °¨ Have a responsible person with you. °¨ Do not drive a car. If you are alone, do not take public transportation. °¨ Do not drink alcohol. °¨ Do not take medicine that has not been prescribed by your health care provider. °¨ Do not sign important papers or make important decisions. °¨ You may resume a normal diet and activities as directed by your health care provider. °· Change bandages (dressings) as directed. °· If you have questions or problems that seem related to general anesthesia, call the hospital and ask for the anesthetist or anesthesiologist on call. °SEEK MEDICAL CARE IF: °· You have nausea and vomiting that continue the day after anesthesia. °· You develop a rash. °SEEK IMMEDIATE MEDICAL CARE IF:  °· You have difficulty breathing. °· You have chest pain. °· You have any allergic problems. °  °This information is not intended to replace advice given to you by your health care provider. Make sure you discuss any questions you have with your health care provider. °  °Document Released: 02/21/2001 Document Revised: 12/06/2014 Document Reviewed: 03/15/2012 °Elsevier Interactive Patient Education ©2016 [employer] ° ° Elevation ° °Ice pack ° °RTC in about 10 days ° °Keep dressing dry ° ° °

## 2016-02-23 ENCOUNTER — Encounter: Payer: Self-pay | Admitting: Unknown Physician Specialty

## 2016-04-14 ENCOUNTER — Other Ambulatory Visit: Payer: Self-pay | Admitting: Family Medicine

## 2016-04-14 DIAGNOSIS — M5126 Other intervertebral disc displacement, lumbar region: Secondary | ICD-10-CM

## 2016-04-14 DIAGNOSIS — M5136 Other intervertebral disc degeneration, lumbar region: Secondary | ICD-10-CM

## 2016-04-14 DIAGNOSIS — M48061 Spinal stenosis, lumbar region without neurogenic claudication: Secondary | ICD-10-CM

## 2016-04-30 ENCOUNTER — Ambulatory Visit: Payer: BC Managed Care – PPO

## 2016-05-06 ENCOUNTER — Ambulatory Visit
Admission: RE | Admit: 2016-05-06 | Discharge: 2016-05-06 | Disposition: A | Discharge status: Home / Self Care | Payer: BC Managed Care – PPO | Source: Ambulatory Visit | Attending: Family Medicine | Admitting: Family Medicine

## 2016-05-06 DIAGNOSIS — M5126 Other intervertebral disc displacement, lumbar region: Secondary | ICD-10-CM | POA: Insufficient documentation

## 2016-05-06 DIAGNOSIS — M5136 Other intervertebral disc degeneration, lumbar region: Secondary | ICD-10-CM

## 2016-05-06 DIAGNOSIS — M4806 Spinal stenosis, lumbar region: Secondary | ICD-10-CM | POA: Diagnosis present

## 2016-05-06 DIAGNOSIS — M48061 Spinal stenosis, lumbar region without neurogenic claudication: Secondary | ICD-10-CM

## 2016-06-24 ENCOUNTER — Emergency Department: Payer: BC Managed Care – PPO

## 2016-06-24 ENCOUNTER — Emergency Department
Admission: EM | Admit: 2016-06-24 | Discharge: 2016-06-24 | Disposition: A | Discharge status: Home / Self Care | Payer: BC Managed Care – PPO | Attending: Emergency Medicine | Admitting: Emergency Medicine

## 2016-06-24 ENCOUNTER — Encounter: Payer: Self-pay | Admitting: Emergency Medicine

## 2016-06-24 DIAGNOSIS — S0101XA Laceration without foreign body of scalp, initial encounter: Secondary | ICD-10-CM | POA: Insufficient documentation

## 2016-06-24 DIAGNOSIS — Y9389 Activity, other specified: Secondary | ICD-10-CM | POA: Diagnosis not present

## 2016-06-24 DIAGNOSIS — S0990XA Unspecified injury of head, initial encounter: Secondary | ICD-10-CM | POA: Diagnosis present

## 2016-06-24 DIAGNOSIS — S93601A Unspecified sprain of right foot, initial encounter: Secondary | ICD-10-CM | POA: Diagnosis not present

## 2016-06-24 DIAGNOSIS — W01198A Fall on same level from slipping, tripping and stumbling with subsequent striking against other object, initial encounter: Secondary | ICD-10-CM | POA: Insufficient documentation

## 2016-06-24 DIAGNOSIS — S93401A Sprain of unspecified ligament of right ankle, initial encounter: Secondary | ICD-10-CM

## 2016-06-24 DIAGNOSIS — Y999 Unspecified external cause status: Secondary | ICD-10-CM | POA: Diagnosis not present

## 2016-06-24 DIAGNOSIS — Y929 Unspecified place or not applicable: Secondary | ICD-10-CM | POA: Diagnosis not present

## 2016-06-24 DIAGNOSIS — S0003XA Contusion of scalp, initial encounter: Secondary | ICD-10-CM

## 2016-06-24 MED ORDER — HYDROCODONE-ACETAMINOPHEN 5-325 MG PO TABS
1.0000 | ORAL_TABLET | Freq: Once | ORAL | Status: AC
  Administered 2016-06-24: 1 via ORAL
  Filled 2016-06-24: qty 1

## 2016-06-24 NOTE — ED Provider Notes (Signed)
[facility] Emergency Department Provider Note  ____________________________________________  Time seen: Approximately 12:35 PM  I have reviewed the triage vital signs and the nursing notes.   HISTORY  Chief Complaint Fall; Head Injury; and Ankle Pain    HPI Erin [name] is a 50 y.o. female , NAD, presents to emergency department with several hour history of fall causing head injury, right ankle and foot pain. Patient states she was in her driveway washing her vehicle when she slipped on the wet pavement falling backwards and hitting her head causing a laceration. Notes that she twisted her right foot/ankle in the process. Does believe she lost consciousness for a few seconds. Has had some mild nausea and sweats that has since resolved. Denies any visual changes, numbness, weakness, tingling, neck or back pain, saddle paresthesias nor loss of bowel or bladder control. 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 Has noted significant pain about the anterior and lateral right ankle along with the top of the foot. Noted swelling and redness to the area after her injury. She is accompanied by her daughter who notes that the patient has been talking and acting per her usual and has had no changes in demeanor. Patient notes significant pain with attempting weightbearing about the right lower extremity. Patient denies any use of blood thinners.   Past Medical History:  Diagnosis Date  . Allergic rhinitis   . Cataract   . Depression   . Hemorrhoid   . Numbness and tingling of right leg    lower leg, s/p disc surg 07/26/15  . Prosthetic eye globe    right    Patient Active Problem List   Diagnosis Date Noted  . 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 Clinical depression 09/01/2015  . Perianal venous thrombosis 09/01/2015  . Encounter for screening colonoscopy 11/06/2014  . Family history of colon cancer requiring screening  colonoscopy 11/06/2014    Past Surgical History:  Procedure Laterality Date  . APPENDECTOMY  2014  . BACK SURGERY  2013, 07/26/15  . COLONOSCOPY  09/2007  . EYE SURGERY Right 2006  . TENNIS ELBOW RELEASE/NIRSCHEL PROCEDURE Left 02/20/2016   Procedure: TENNIS ELBOW RELEASE/NIRSCHEL PROCEDURE;  Surgeon: Erin Sons, MD;  Location: [facility];  Service: Orthopedics;  Laterality: Left;  . UPPER GI ENDOSCOPY  02/2006    Current Outpatient Rx  . Order #: [id number] Class: Historical Med  . Order #: [id number] Class: Historical Med  . Order #: [id number] Class: Historical Med  . Order #: [id number] Class: Historical Med  . Order #: [id number] Class: Historical Med  . Order #: [id number] Class: Historical Med  . Order #: [id number] Class: Print    Allergies Doxycycline  Family History  Problem Relation Age of Onset  . Colon cancer Paternal Grandmother     32's  . Colon cancer Paternal Aunt     great aunt 2  . Colon cancer Paternal Uncle     great    Social History Social History  Substance Use Topics  . Smoking status: Never Smoker  . Smokeless tobacco: Never Used  . Alcohol use No     Review of Systems  Constitutional: No fever/chills, Fatigue Eyes: No visual changes.  Cardiovascular: No chest pain, Palpitations. Respiratory: No cough. No shortness of breath. No wheezing.  Gastrointestinal: No abdominal pain.  No nausea, vomiting.  Musculoskeletal: Positive right ankle and foot pain. Negative for back, neck, hip, knee pain.  Skin: Positive laceration of scalp with  bleeding controlled. Positive bruising and swelling right foot and ankle. Negative for rash. Neurological: Positive head injury with brief LOC. Positive for headaches, but no focal weakness or numbness. No tingling. No saddle paresthesias nor loss of bowel or bladder control. 10-point ROS otherwise negative.  ____________________________________________   PHYSICAL EXAM:  VITAL SIGNS: ED Triage Vitals   Enc Vitals Group     BP 06/24/16 1122 121/79     Pulse Rate 06/24/16 1122 80     Resp 06/24/16 1122 18     Temp 06/24/16 1122 98.7 F (37.1 C)     Temp Source 06/24/16 1122 Oral     SpO2 06/24/16 1122 100 %     Weight 06/24/16 1122 145 lb (65.8 kg)     Height 06/24/16 1122 5\' 5"  (1.651 m)     Head Circumference --      Peak Flow --      Pain Score 06/24/16 1123 6     Pain Loc --      Pain Edu? --      Excl. in GC? --      Constitutional: Alert and oriented. Well appearing and in no acute distress. Eyes: Conjunctivae are normal. PERRLA. EOMI without pain.  Head: Normocephalic. ENT:      Ears: No discharge noted from bilateral ear canals      Nose: No congestion/rhinnorhea.      Mouth/Throat: Mucous membranes are moist.  Neck:Supple with full range of motion. No cervical spine tenderness to palpation. No trapezial muscle spasm or tenderness to palpation. Hematological/Lymphatic/Immunilogical: No cervical lymphadenopathy. Cardiovascular: Normal rate, regular rhythm. Normal S1 and S2.  No murmurs, rubs, gallops. Good peripheral circulation with 2+ pulses noted in the right lower extremity. Capillary refill is brisk in all digits of the right foot. Respiratory: Normal respiratory effort without tachypnea or retractions. Lungs CTAB with breath sounds noted in all lung fields. No wheeze, rhonchi, rales. Musculoskeletal: Tenderness to palpation about the lateral and anterior right ankle as well as the dorsal portion of the right foot. There is mild swelling and ecchymosis noted about the dorsal right foot. Patient does have dropfoot which is a chronic condition. No lower extremity tenderness nor edema.  No joint effusions. Neurologic:  Normal speech and language. No gross focal neurologic deficits are appreciated. Cranial nerves III through XII grossly intact. Skin:  0.5cm superficial laceration noted about the posterior scalp over a 2 cm oblong hematoma. Bleeding is controlled. Skin is  warm, dry. No rash noted. Psychiatric: Mood and affect are normal. Speech and behavior are normal. Patient exhibits appropriate insight and judgement.   ____________________________________________   LABS  None ____________________________________________  EKG  None ____________________________________________  RADIOLOGY I have personally viewed and evaluated these images (plain radiographs) as part of my medical decision making, as well as reviewing the written report by the radiologist.  Dg Ankle Complete Right  Result Date: 06/24/2016 CLINICAL DATA:  Post fall earlier today.  Pain. EXAM: RIGHT ANKLE - COMPLETE 3+ VIEW COMPARISON:  None. FINDINGS: There is no evidence of fracture, dislocation, or joint effusion. There is no evidence of arthropathy or other focal bone abnormality. Soft tissue swelling anteriorly. IMPRESSION: Negative. Electronically Signed   By: Elsie Stain M.D.   On: 06/24/2016 11:51  Ct Head Wo Contrast  Result Date: 06/24/2016 CLINICAL DATA:  Recent fall earlier today.  Head pain.  Neck pain. EXAM: CT HEAD WITHOUT CONTRAST CT CERVICAL SPINE WITHOUT CONTRAST TECHNIQUE: Multidetector CT imaging of the head and cervical  spine was performed following the standard protocol without intravenous contrast. Multiplanar CT image reconstructions of the cervical spine were also generated. COMPARISON:  MR brain 08/29/2010. FINDINGS: CT HEAD FINDINGS No evidence for acute stroke, acute hemorrhage, intra-axial mass lesion, hydrocephalus, or extra-axial fluid. Normal cerebral volume without white matter disease. There is a large scalp hematoma LEFT paramedian parieto-occipital region. No underlying skull fracture. No contrecoup injury. Near the vertex, parasagittal location, straddling what probably represents the central sulcus, is a 13 x 14 mm hyperdense lesion, extra-axial, consistent with a meningioma. This was present on the previous study but not reported. The lesion is not  significantly enlarged from priors, but elective MRI of the brain without and with contrast recommended for further evaluation as a baseline, given its proximity to the superior sagittal sinus. No sinus or mastoid disease. 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 CT CERVICAL SPINE FINDINGS There is no visible cervical spine fracture, traumatic subluxation, prevertebral soft tissue swelling, or intraspinal hematoma. Mild reversal of the normal cervical lordotic curve. Disc space narrowing is present at C5-6. Osseous ridging is present at C4-5 and C5-6. Facets are normally aligned. Slight ossification posterior longitudinal ligament. No neck masses. Visualized lung apices show no pneumothorax. Dolichoectatic vasculature. IMPRESSION: No skull fracture or intracranial hemorrhage. Moderate-sized LEFT paramedian posterior scalp hematoma. Incidental 13 x 14 mm LEFT parasagittal extra-axial frontoparietal lesion consistent with meningioma. Elective MRI should be performed as a baseline. No cervical spine fracture or traumatic subluxation. Mild multilevel spondylosis. Electronically Signed   By: Elsie Stain M.D.   On: 06/24/2016 12:29  Ct Cervical Spine Wo Contrast  Result Date: 06/24/2016 CLINICAL DATA:  Recent fall earlier today.  Head pain.  Neck pain. EXAM: CT HEAD WITHOUT CONTRAST CT CERVICAL SPINE WITHOUT CONTRAST TECHNIQUE: Multidetector CT imaging of the head and cervical spine was performed following the standard protocol without intravenous contrast. Multiplanar CT image reconstructions of the cervical spine were also generated. COMPARISON:  MR brain 08/29/2010. FINDINGS: CT HEAD FINDINGS No evidence for acute stroke, acute hemorrhage, intra-axial mass lesion, hydrocephalus, or extra-axial fluid. Normal cerebral volume without white matter disease. There is a large scalp hematoma LEFT paramedian parieto-occipital region. No underlying skull fracture. No contrecoup injury.  Near the vertex, parasagittal location, straddling what probably represents the central sulcus, is a 13 x 14 mm hyperdense lesion, extra-axial, consistent with a meningioma. This was present on the previous study but not reported. The lesion is not significantly enlarged from priors, but elective MRI of the brain without and with contrast recommended for further evaluation as a baseline, given its proximity to the superior sagittal sinus. No sinus or mastoid disease. 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 CT CERVICAL SPINE FINDINGS There is no visible cervical spine fracture, traumatic subluxation, prevertebral soft tissue swelling, or intraspinal hematoma. Mild reversal of the normal cervical lordotic curve. Disc space narrowing is present at C5-6. Osseous ridging is present at C4-5 and C5-6. Facets are normally aligned. Slight ossification posterior longitudinal ligament. No neck masses. Visualized lung apices show no pneumothorax. Dolichoectatic vasculature. IMPRESSION: No skull fracture or intracranial hemorrhage. Moderate-sized LEFT paramedian posterior scalp hematoma. Incidental 13 x 14 mm LEFT parasagittal extra-axial frontoparietal lesion consistent with meningioma. Elective MRI should be performed as a baseline. No cervical spine fracture or traumatic subluxation. Mild multilevel spondylosis. Electronically Signed   By: Elsie Stain M.D.   On: 06/24/2016 12:29  Dg Foot Complete Right  Result Date: 06/24/2016 CLINICAL  DATA:  Status post slip and fall while washing the car this morning resulting in a right foot injury. Pain. Initial encounter. EXAM: RIGHT FOOT COMPLETE - 3+ VIEW COMPARISON:  None. FINDINGS: There is no evidence of fracture or dislocation. There is no evidence of arthropathy or other focal bone abnormality. Soft tissues are unremarkable. IMPRESSION: Negative exam. Electronically Signed   By: Drusilla Kanner M.D.   On: 06/24/2016  13:16   ____________________________________________    PROCEDURES  Procedure(s) performed: None      Medications  HYDROcodone-acetaminophen (NORCO/VICODIN) 5-325 MG per tablet 1 tablet (1 tablet Oral Given 06/24/16 1302)     ____________________________________________   INITIAL IMPRESSION / ASSESSMENT AND PLAN / ED COURSE  Pertinent labs & imaging results that were available during my care of the patient were reviewed by me and considered in my medical decision making (see chart for details).  Patient's diagnosis is consistent with right ankle and foot sprain, head injury, hematoma of scalp and laceration of scalp due to slip and fall. Patient will be discharged home with detailed instructions for home care as outlined in exit care. Patient's right foot and ankle was wrapped in an Ace wrap and patient has crutches at home in which she would like to use as needed. She is to apply ice to the affected right ankle and foot as well as the back of her scalp 20 minutes 3-4 times daily as needed. Patient is to follow up with her primary care provider in 1-2 days for recheck as needed. Patient is given strict ED precautions to return to the ED for any worsening or new symptoms.      ____________________________________________  FINAL CLINICAL IMPRESSION(S) / ED DIAGNOSES  Final diagnoses:  Right ankle sprain, initial encounter  Right foot sprain, initial encounter  Head injury, initial encounter  Hematoma of scalp, initial encounter  Laceration of skin of scalp, initial encounter  Fall on same level from slipping, tripping and stumbling with subsequent striking against other object, initial encounter      NEW MEDICATIONS STARTED DURING THIS VISIT:  Discharge Medication List as of 06/24/2016  1:26 PM           Hope Pigeon, PA-C 06/24/16 1405    Myrna Blazer, MD 06/24/16 1535

## 2016-06-24 NOTE — ED Triage Notes (Signed)
Patient presents to the ED post fall around 10:15am.  Patient states she was washing her car and slipped on the car port and fell backwards, hitting the back of her head and twisting her right ankle.  Ankle appears swollen and deformed and patient has hematoma to the back of her head.  Patient denies taking blood thinners. Patient is unsure of whether or not she passed out.

## 2016-06-24 NOTE — ED Notes (Signed)
Pt reports washing the car this am when she slipped in the carport and fell resulting in her hitting her head and twisting her ankle   Pt is unaware is she lost consciousness

## 2017-01-03 ENCOUNTER — Other Ambulatory Visit: Payer: Self-pay | Admitting: Obstetrics and Gynecology

## 2017-01-04 ENCOUNTER — Other Ambulatory Visit: Payer: Self-pay | Admitting: Obstetrics and Gynecology

## 2017-01-04 DIAGNOSIS — N6323 Unspecified lump in the left breast, lower outer quadrant: Secondary | ICD-10-CM

## 2017-01-31 ENCOUNTER — Encounter: Payer: Self-pay | Admitting: Obstetrics and Gynecology

## 2017-01-31 ENCOUNTER — Ambulatory Visit (INDEPENDENT_AMBULATORY_CARE_PROVIDER_SITE_OTHER): Payer: BC Managed Care – PPO | Admitting: Obstetrics and Gynecology

## 2017-01-31 VITALS — BP 112/72 | HR 94 | Ht 65.0 in | Wt 154.0 lb

## 2017-01-31 DIAGNOSIS — Z6825 Body mass index (BMI) 25.0-25.9, adult: Secondary | ICD-10-CM | POA: Diagnosis not present

## 2017-01-31 DIAGNOSIS — E663 Overweight: Secondary | ICD-10-CM | POA: Diagnosis not present

## 2017-01-31 MED ORDER — PHENTERMINE HCL 37.5 MG PO TABS: 37.5000 mg | ORAL_TABLET | Freq: Every day | ORAL | 0 refills | Status: AC

## 2017-01-31 NOTE — Patient Instructions (Signed)

## 2017-01-31 NOTE — Progress Notes (Signed)
Gynecology Office Visit  Chief Complaint:  Chief Complaint  Patient presents with  . Weight Check    History of Present Illness: Patientis a 51 y.o. 833P0 female, who presents for the evaluation of the desire to lose weight. She has lost 0 pounds. The patient states the following symptoms since starting her weight loss therapy: appetite suppression, energy, and weight loss.  The patient also reports no other ill effects. The patient specifically denies heart palpitations, anxiety, and insomnia.   Review of Systems: Review of Systems  Constitutional: Negative for weight loss.  Cardiovascular: Negative for chest pain and palpitations.  Gastrointestinal: Negative for abdominal pain.  Psychiatric/Behavioral: Negative for depression. The patient is not nervous/anxious and does not have insomnia.     Past Medical History:  Past Medical History:  Diagnosis Date  . Allergic rhinitis   . Cataract   . Depression   . Hemorrhoid   . Numbness and tingling of right leg    lower leg, s/p disc surg 07/26/15  . Prosthetic eye globe    right    Past Surgical History:  Past Surgical History:  Procedure Laterality Date  . APPENDECTOMY  2014  . BACK SURGERY  2013, 07/26/15  . COLONOSCOPY  09/2007  . EYE SURGERY Right 2006  . TENNIS ELBOW RELEASE/NIRSCHEL PROCEDURE Left 02/20/2016   Procedure: TENNIS ELBOW RELEASE/NIRSCHEL PROCEDURE;  Surgeon: Erin SonsHarold Kernodle, MD;  [facility];  Service: Orthopedics;  Laterality: Left;  . UPPER GI ENDOSCOPY  02/2006    Gynecologic History: No LMP recorded. Patient is not currently having periods (Reason: Perimenopausal).  Obstetric History: G3P0  Family History:  Family History  Problem Relation Age of Onset  . Colon cancer Paternal Grandmother     6550's  . Colon cancer Paternal Aunt     great aunt 2  . Colon cancer Paternal Uncle     great    Social History:  Social History   Social History  . Marital status: Single   Spouse name: N/A  . Number of children: N/A  . Years of education: N/A   Occupational History  . Not on file.   Social History Main Topics  . Smoking status: Never Smoker  . Smokeless tobacco: Never Used  . Alcohol use No  . Drug use: No  . Sexual activity: Not on file   Other Topics Concern  . Not on file   Social History Narrative  . No narrative on file    Allergies:  Allergies  Allergen Reactions  . Doxycycline     Other reaction(s): Other (See Comments) Blister in mouth    Medications: Prior to Admission medications   Medication Sig Start Date End Date Taking? Authorizing Provider  citalopram (CELEXA) 20 MG tablet  12/27/16   Historical Provider, MD  gabapentin (NEURONTIN) 300 MG capsule  01/25/17   Historical Provider, MD  Multiple Vitamin (MULTIVITAMIN) capsule Take 1 capsule by mouth daily.    Historical Provider, MD  neomycin-polymyxin b-dexamethasone (MAXITROL) 3.5-10000-0.1 OINT  01/18/17   Historical Provider, MD  phentermine (ADIPEX-P) 37.5 MG tablet Take 37.5 mg by mouth daily before breakfast.    Vena AustriaAndreas Samadhi Mahurin, MD  pyridOXINE (VITAMIN B-6) 25 MG tablet Take by mouth.    Historical Provider, MD  ROXICET 5-325 MG tablet Take 1-2 tablets by mouth every 6 (six) hours as needed for severe pain. 02/20/16   Erin SonsHarold Kernodle, MD    Physical Exam Vitals:  Vitals:   01/31/17 1449  BP:  112/72  Pulse: 94   No LMP recorded. Patient is not currently having periods (Reason: Perimenopausal).  General: NAD HEENT: normocephalic, anicteric Thyroid: no enlargement Pulmonary: no increased work of breathing Neurologic: Grossly intact Psychiatric: mood appropriate, affect full  Assessment: 51 y.o. G3P0 No problem-specific Assessment & Plan notes found for this encounter.   Plan: Problem List Items Addressed This Visit    None    Visit Diagnoses    Overweight (BMI 25.0-29.9)    -  Primary   BMI 25.0-25.9,adult          1) 1800 Calorie ADA Diet  2) Patient  education given regarding appropriate lifestyle changes for weight loss including: regular physical activity, healthy coping strategies, caloric restriction and healthy eating patterns.  3) Patient will be started on weight loss medication. The risks and benefits and side effects of medication, such as Adipex (Phenteramine) ,  Tenuate (Diethylproprion), Belviq (lorcarsin), Contrave (buproprion/naltrexone), Qsymia (phentermine/topiramate), and Saxenda (liraglutide) is discussed. The pros and cons of suppressing appetite and boosting metabolism is discussed. Risks of tolerence and addiction is discussed for selected agents discussed. Use of medicine will ne short term, such as 3-4 months at a time followed by a period of time off of the medicine to avoid these risks and side effects for Adipex, Qsymia, and Tenuate discussed. Pt to call with any negative side effects and agrees to keep follow up appts.  4) Patient to take medication, with the benefits of appetite suppression and metabolism boost d/w pt, along with the side effects and risk factors of long term use that will be avoided with our use of short bursts of therapy. Rx provided.    5) 15 minutes face-to-face; with counseling/coordination of care > 50 percent of visit related to obesity and ongoing management/treatment   6)Folluw up PRN

## 2017-02-04 ENCOUNTER — Ambulatory Visit
Admission: RE | Admit: 2017-02-04 | Discharge: 2017-02-04 | Disposition: A | Discharge status: Home / Self Care | Payer: BC Managed Care – PPO | Source: Ambulatory Visit | Attending: Obstetrics and Gynecology | Admitting: Obstetrics and Gynecology

## 2017-02-04 DIAGNOSIS — N6323 Unspecified lump in the left breast, lower outer quadrant: Secondary | ICD-10-CM | POA: Insufficient documentation
